# Patient Record
Sex: Female | Born: 1970 | Race: White | Hispanic: No | Marital: Married | Smoking: Never smoker
Health system: Southern US, Community
[De-identification: ages and names within clinical notes are randomized; demographics above are authoritative.]

## PROBLEM LIST (undated history)

## (undated) DIAGNOSIS — L0291 Cutaneous abscess, unspecified: Secondary | ICD-10-CM

## (undated) DIAGNOSIS — Z8619 Personal history of other infectious and parasitic diseases: Secondary | ICD-10-CM

## (undated) DIAGNOSIS — F419 Anxiety disorder, unspecified: Secondary | ICD-10-CM

## (undated) DIAGNOSIS — C50312 Malignant neoplasm of lower-inner quadrant of left female breast: Secondary | ICD-10-CM

## (undated) HISTORY — DX: Personal history of other infectious and parasitic diseases: Z86.19

## (undated) HISTORY — DX: Anxiety disorder, unspecified: F41.9

## (undated) HISTORY — PX: MASTECTOMY PARTIAL / LUMPECTOMY: SUR851

## (undated) HISTORY — DX: Malignant neoplasm of lower-inner quadrant of left female breast: C50.312

## (undated) HISTORY — DX: Cutaneous abscess, unspecified: L02.91

---

## 2010-03-30 ENCOUNTER — Emergency Department (HOSPITAL_COMMUNITY)
Admission: EM | Admit: 2010-03-30 | Discharge: 2010-03-30 | Payer: Self-pay | Source: Home / Self Care | Admitting: Emergency Medicine

## 2010-04-03 LAB — DIFFERENTIAL
Basophils Absolute: 0 10*3/uL (ref 0.0–0.1)
Basophils Relative: 0 % (ref 0–1)
Eosinophils Absolute: 0.1 10*3/uL (ref 0.0–0.7)
Eosinophils Relative: 2 % (ref 0–5)
Lymphocytes Relative: 27 % (ref 12–46)
Lymphs Abs: 1.1 10*3/uL (ref 0.7–4.0)
Monocytes Absolute: 0.3 10*3/uL (ref 0.1–1.0)
Monocytes Relative: 8 % (ref 3–12)
Neutro Abs: 2.6 10*3/uL (ref 1.7–7.7)
Neutrophils Relative %: 63 % (ref 43–77)

## 2010-04-03 LAB — POCT I-STAT, CHEM 8
BUN: 10 mg/dL (ref 6–23)
Calcium, Ion: 1.15 mmol/L (ref 1.12–1.32)
Chloride: 105 mEq/L (ref 96–112)
Creatinine, Ser: 0.9 mg/dL (ref 0.4–1.2)
Glucose, Bld: 96 mg/dL (ref 70–99)
HCT: 38 % (ref 36.0–46.0)
Hemoglobin: 12.9 g/dL (ref 12.0–15.0)
Potassium: 4.1 mEq/L (ref 3.5–5.1)
Sodium: 140 mEq/L (ref 135–145)
TCO2: 25 mmol/L (ref 0–100)

## 2010-04-03 LAB — URINE MICROSCOPIC-ADD ON

## 2010-04-03 LAB — CBC
HCT: 37.4 % (ref 36.0–46.0)
Hemoglobin: 12.7 g/dL (ref 12.0–15.0)
MCH: 30.5 pg (ref 26.0–34.0)
MCHC: 34 g/dL (ref 30.0–36.0)
MCV: 89.7 fL (ref 78.0–100.0)
Platelets: 191 10*3/uL (ref 150–400)
RBC: 4.17 MIL/uL (ref 3.87–5.11)
RDW: 12.3 % (ref 11.5–15.5)
WBC: 4.2 10*3/uL (ref 4.0–10.5)

## 2010-04-03 LAB — URINALYSIS, ROUTINE W REFLEX MICROSCOPIC
Bilirubin Urine: NEGATIVE
Hgb urine dipstick: NEGATIVE
Ketones, ur: NEGATIVE mg/dL
Nitrite: NEGATIVE
Protein, ur: NEGATIVE mg/dL
Specific Gravity, Urine: 1.007 (ref 1.005–1.030)
Urine Glucose, Fasting: NEGATIVE mg/dL
Urobilinogen, UA: 1 mg/dL (ref 0.0–1.0)
pH: 7.5 (ref 5.0–8.0)

## 2011-04-03 ENCOUNTER — Ambulatory Visit (INDEPENDENT_AMBULATORY_CARE_PROVIDER_SITE_OTHER): Payer: Self-pay | Admitting: General Surgery

## 2011-04-19 ENCOUNTER — Ambulatory Visit (INDEPENDENT_AMBULATORY_CARE_PROVIDER_SITE_OTHER): Payer: 59 | Admitting: General Surgery

## 2011-05-10 ENCOUNTER — Ambulatory Visit (INDEPENDENT_AMBULATORY_CARE_PROVIDER_SITE_OTHER): Payer: 59 | Admitting: General Surgery

## 2011-05-14 ENCOUNTER — Ambulatory Visit (INDEPENDENT_AMBULATORY_CARE_PROVIDER_SITE_OTHER): Payer: 59 | Admitting: General Surgery

## 2011-05-14 ENCOUNTER — Encounter (INDEPENDENT_AMBULATORY_CARE_PROVIDER_SITE_OTHER): Payer: Self-pay | Admitting: General Surgery

## 2011-05-14 VITALS — BP 114/78 | HR 80 | Temp 99.1°F | Resp 16 | Ht 64.0 in | Wt 138.4 lb

## 2011-05-14 DIAGNOSIS — K603 Anal fistula: Secondary | ICD-10-CM

## 2011-05-14 NOTE — Progress Notes (Signed)
Patient ID: Jeanette Macdonald, female   DOB: 01-Dec-1970, 41 y.o.   MRN: [id number]  Chief Complaint  Patient presents with  . Abscess    new pt- eval peri-anal abscess    HPI Jeanette Macdonald is a 41 y.o. female.  Referred by Dr. Loreta Ave HPI This is a 88 yof who presents after having what sounds like a perianal abscess incised and drained about a year and a half ago in [place].  After that she had continued drainage from the incision site on her left buttock.  Eventually she began noting drainage from her anus that was similar in nature.  This will heal over, then swell and drain intermittently since then. She moved in the interim and that has accounted for delay in treatment.  She denies fevers.  She denied any changes in bowel movements or blood to me today.  There are really no relieving or aggravating factors she associates with this.    Past Medical History  Diagnosis Date  . Abscess     perianal    Past Surgical History  Procedure Date  . Cesarean section 02/02/08    Family History  Problem Relation Age of Onset  . Cancer Maternal Grandmother     stomach  . Cancer Paternal Grandmother     colon    Social History History  Substance Use Topics  . Smoking status: Never Smoker   . Smokeless tobacco: Not on file  . Alcohol Use: Yes    Allergies  Allergen Reactions  . Penicillins Hives    No current outpatient prescriptions on file.    Review of Systems Review of Systems  Constitutional: Negative for fever, chills and unexpected weight change.  HENT: Negative for hearing loss, congestion, sore throat, trouble swallowing and voice change.   Eyes: Negative for visual disturbance.  Respiratory: Negative for cough and wheezing.   Cardiovascular: Negative for chest pain, palpitations and leg swelling.  Gastrointestinal: Negative for nausea, vomiting, abdominal pain, diarrhea, constipation, blood in stool, abdominal distention and anal bleeding.  Genitourinary: Negative for  hematuria, vaginal bleeding and difficulty urinating.  Musculoskeletal: Negative for arthralgias.  Skin: Negative for rash and wound.  Neurological: Negative for seizures, syncope and headaches.  Hematological: Negative for adenopathy. Does not bruise/bleed easily.  Psychiatric/Behavioral: Negative for confusion.    Blood pressure 114/78, pulse 80, temperature 99.1 F (37.3 C), temperature source Temporal, resp. rate 16, height 5\' 4"  (1.626 m), weight 138 lb 6.4 oz (62.778 kg).  Physical Exam Physical Exam  Vitals reviewed. Constitutional: She appears well-developed and well-nourished.  Cardiovascular: Normal rate, regular rhythm and normal heart sounds.   Pulmonary/Chest: Effort normal and breath sounds normal. She has no wheezes. She has no rales.  Abdominal: Soft. There is no hepatomegaly.  Genitourinary: Rectal exam shows mass and tenderness (on anoscopy I do not identify an internal opening but did see some of what appeared to be whitish drainage from anus). Rectal exam shows no external hemorrhoid, no internal hemorrhoid, no fissure and anal tone normal.    Pelvic exam was performed with patient in the knee-chest position.    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  We discussed surgery.  I think this is obviously not going to get better conservatively.  We discussed going to the OR for exam under anesthesia.  She has no difficulty with continence right now.  I discussed opening fistula and leaving open to heal by secondary intention, seton placement as well as fibrin plug placement depending on findings on exam under anesthesia.  We discussed postoperative course associated with this procedure.  It is not urgent given chronicity of disease.  We discussed risks including recurrence, infection, operative  injury.  She understands.  Will discuss with her husband and call.       Donnalee Cellucci 05/14/2011, 9:16 PM

## 2012-01-18 ENCOUNTER — Telehealth: Payer: Self-pay | Admitting: Obstetrics and Gynecology

## 2012-01-19 ENCOUNTER — Ambulatory Visit (INDEPENDENT_AMBULATORY_CARE_PROVIDER_SITE_OTHER): Payer: 59 | Admitting: Family Medicine

## 2012-01-19 VITALS — BP 103/67 | HR 73 | Temp 98.0°F | Resp 16 | Ht 64.5 in | Wt 139.0 lb

## 2012-01-19 DIAGNOSIS — R05 Cough: Secondary | ICD-10-CM

## 2012-01-19 DIAGNOSIS — R059 Cough, unspecified: Secondary | ICD-10-CM

## 2012-01-19 DIAGNOSIS — R198 Other specified symptoms and signs involving the digestive system and abdomen: Secondary | ICD-10-CM

## 2012-01-19 DIAGNOSIS — B349 Viral infection, unspecified: Secondary | ICD-10-CM

## 2012-01-19 DIAGNOSIS — R1011 Right upper quadrant pain: Secondary | ICD-10-CM

## 2012-01-19 LAB — POCT CBC
Granulocyte percent: 56.8 %G (ref 37–80)
HCT, POC: 46.3 % (ref 37.7–47.9)
Hemoglobin: 14.6 g/dL (ref 12.2–16.2)
Lymph, poc: 1.9 (ref 0.6–3.4)
MCH, POC: 29.4 pg (ref 27–31.2)
MCHC: 31.5 g/dL — AB (ref 31.8–35.4)
MCV: 93.4 fL (ref 80–97)
MID (cbc): 0.4 (ref 0–0.9)
MPV: 9.3 fL (ref 0–99.8)
POC Granulocyte: 3 (ref 2–6.9)
POC LYMPH PERCENT: 36.5 %L (ref 10–50)
POC MID %: 6.7 %M (ref 0–12)
Platelet Count, POC: 246 10*3/uL (ref 142–424)
RBC: 4.96 M/uL (ref 4.04–5.48)
RDW, POC: 13.2 %
WBC: 5.3 10*3/uL (ref 4.6–10.2)

## 2012-01-19 NOTE — Progress Notes (Signed)
He is a 41 year old married woman (with a 41-year-old daughter) who works in Education officer, environmental. Beginning 6 days ago patient developed malaise and fatigue and felt that she's coming down with a virus. Beginning on Halloween night 48 hours prior to this visit, patient developed a hypersensitivity burning pain in the skin of her right abdomen right flank and left mid abdomen. She thought she might be developing shingles but she has not seen any rash. She's had no fever, no deep abdominal pain, no nausea no vomiting, no diarrhea. She has had a dry cough however for several days. She's not had any sore throat and no blood parietum.  Objective: Thin healthy appearing woman in no acute distress HEENT: Unremarkable Chest: Clear Abdomen: Soft nontender without HSM or masses Skin: No rash, patient does show goosebumps diffusely on her back Results for orders placed in visit on 01/19/12  POCT CBC      Component Value Range   WBC 5.3  4.6 - 10.2 K/uL   Lymph, poc 1.9  0.6 - 3.4   POC LYMPH PERCENT 36.5  10 - 50 %L   MID (cbc) 0.4  0 - 0.9   POC MID % 6.7  0 - 12 %M   POC Granulocyte 3.0  2 - 6.9   Granulocyte percent 56.8  37 - 80 %G   RBC 4.96  4.04 - 5.48 M/uL   Hemoglobin 14.6  12.2 - 16.2 g/dL   HCT, POC 21.3  08.6 - 47.9 %   MCV 93.4  80 - 97 fL   MCH, POC 29.4  27 - 31.2 pg   MCHC 31.5 (*) 31.8 - 35.4 g/dL   RDW, POC 57.8     Platelet Count, POC 246  142 - 424 K/uL   MPV 9.3  0 - 99.8 fL     Assessment:  This appears to be a viral syndrome and should resolve over the next several days

## 2012-04-04 ENCOUNTER — Encounter: Payer: Self-pay | Admitting: Obstetrics and Gynecology

## 2012-04-04 ENCOUNTER — Ambulatory Visit: Payer: 59 | Admitting: Obstetrics and Gynecology

## 2012-04-04 VITALS — BP 100/60 | HR 82 | Ht 64.0 in | Wt 140.0 lb

## 2012-04-04 DIAGNOSIS — Z124 Encounter for screening for malignant neoplasm of cervix: Secondary | ICD-10-CM

## 2012-04-04 DIAGNOSIS — Z1231 Encounter for screening mammogram for malignant neoplasm of breast: Secondary | ICD-10-CM

## 2012-04-04 MED ORDER — ALPRAZOLAM 0.5 MG PO TABS: 0.5000 mg | ORAL_TABLET | Freq: Every evening | ORAL | Status: DC | PRN

## 2012-04-04 NOTE — Progress Notes (Signed)
Last Pap: 06/22/10 WNL: Yes Regular Periods:yes Contraception: husband vasectomy   Monthly Breast exam:no Tetanus<83yrs:yes Nl.Bladder Function:yes Daily BMs:yes Healthy Diet:yes Calcium:no Mammogram:yes Date of Mammogram: 4 years ago Exercise:no Have often Exercise: n/a Seatbelt: yes Abuse at home: no Stressful work:yes Sigmoid-colonoscopy: 1 yr ago per pt due to fistula Bone Density: No PCP:  Change in PMH: none  Change in [facility]: none BP 100/60  Pulse 82  Ht 5\' 4"  (1.626 m)  Wt 140 lb (63.504 kg)  BMI 24.03 kg/m2  LMP 03/12/2012 Pt here for AEX.  She would like xanax to use on her flights.  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  Small fistula seen on perineum Physical Examination: Extremities - peripheral pulses normal, no pedal edema, no clubbing or cyanosis AEX exam Pap sent Pt with rectovaginal fistula.  She was seen by GI and general surgery.  She desires to monitor and observe for now Anxiety with flying.  Pt given xanax prn flying.  R&B with possible addiction discussed in detail Pt refused breast exam.  She did schedule mammogram  Extremities - peripheral pulses normal, no pedal edema, no clubbing or cyanosis

## 2012-04-07 LAB — PAP IG W/ RFLX HPV ASCU

## 2012-04-29 ENCOUNTER — Ambulatory Visit: Payer: 59

## 2012-06-02 ENCOUNTER — Ambulatory Visit
Admission: RE | Admit: 2012-06-02 | Discharge: 2012-06-02 | Disposition: A | Discharge status: Home / Self Care | Payer: 59 | Source: Ambulatory Visit | Attending: Obstetrics and Gynecology | Admitting: Obstetrics and Gynecology

## 2012-06-02 DIAGNOSIS — Z1231 Encounter for screening mammogram for malignant neoplasm of breast: Secondary | ICD-10-CM

## 2013-02-26 ENCOUNTER — Other Ambulatory Visit: Payer: Self-pay | Admitting: Gastroenterology

## 2013-02-26 DIAGNOSIS — K611 Rectal abscess: Secondary | ICD-10-CM

## 2013-02-26 DIAGNOSIS — R1032 Left lower quadrant pain: Secondary | ICD-10-CM

## 2013-03-13 ENCOUNTER — Other Ambulatory Visit: Payer: 59

## 2013-03-22 ENCOUNTER — Inpatient Hospital Stay: Admission: RE | Admit: 2013-03-22 | Payer: 59 | Source: Ambulatory Visit

## 2013-10-23 ENCOUNTER — Emergency Department (HOSPITAL_COMMUNITY)
Admission: EM | Admit: 2013-10-23 | Discharge: 2013-10-23 | Disposition: A | Discharge status: Home / Self Care | Payer: 59 | Attending: Emergency Medicine | Admitting: Emergency Medicine

## 2013-10-23 ENCOUNTER — Emergency Department (HOSPITAL_COMMUNITY): Payer: 59

## 2013-10-23 ENCOUNTER — Encounter (HOSPITAL_COMMUNITY): Payer: Self-pay | Admitting: Emergency Medicine

## 2013-10-23 DIAGNOSIS — Z872 Personal history of diseases of the skin and subcutaneous tissue: Secondary | ICD-10-CM | POA: Insufficient documentation

## 2013-10-23 DIAGNOSIS — Z7982 Long term (current) use of aspirin: Secondary | ICD-10-CM | POA: Insufficient documentation

## 2013-10-23 DIAGNOSIS — F411 Generalized anxiety disorder: Secondary | ICD-10-CM | POA: Diagnosis not present

## 2013-10-23 DIAGNOSIS — R072 Precordial pain: Secondary | ICD-10-CM | POA: Insufficient documentation

## 2013-10-23 DIAGNOSIS — Z880 Allergy status to penicillin: Secondary | ICD-10-CM | POA: Insufficient documentation

## 2013-10-23 DIAGNOSIS — R079 Chest pain, unspecified: Secondary | ICD-10-CM

## 2013-10-23 LAB — BASIC METABOLIC PANEL
Anion gap: 11 (ref 5–15)
BUN: 11 mg/dL (ref 6–23)
CO2: 25 mEq/L (ref 19–32)
Calcium: 9.3 mg/dL (ref 8.4–10.5)
Chloride: 100 mEq/L (ref 96–112)
Creatinine, Ser: 0.78 mg/dL (ref 0.50–1.10)
GFR calc Af Amer: >90 mL/min (ref >90)
GFR calc non Af Amer: >90 mL/min (ref >90)
Glucose, Bld: 79 mg/dL (ref 70–99)
Potassium: 4.4 mEq/L (ref 3.7–5.3)
Sodium: 136 mEq/L — ABNORMAL LOW (ref 137–147)

## 2013-10-23 LAB — CBC
HCT: 37 % (ref 36.0–46.0)
Hemoglobin: 12.9 g/dL (ref 12.0–15.0)
MCH: 30.8 pg (ref 26.0–34.0)
MCHC: 34.9 g/dL (ref 30.0–36.0)
MCV: 88.3 fL (ref 78.0–100.0)
Platelets: 152 10*3/uL (ref 150–400)
RBC: 4.19 MIL/uL (ref 3.87–5.11)
RDW: 12.3 % (ref 11.5–15.5)
WBC: 3 10*3/uL — ABNORMAL LOW (ref 4.0–10.5)

## 2013-10-23 LAB — I-STAT TROPONIN, ED
Troponin i, poc: 0 ng/mL (ref 0.00–0.08)
Troponin i, poc: 0 ng/mL (ref 0.00–0.08)

## 2013-10-23 MED ORDER — NITROGLYCERIN 0.4 MG SL SUBL
0.4000 mg | SUBLINGUAL_TABLET | SUBLINGUAL | Status: DC | PRN
  Filled 2013-10-23: qty 1

## 2013-10-23 MED ORDER — ASPIRIN 325 MG PO TABS
325.0000 mg | ORAL_TABLET | Freq: Once | ORAL | Status: AC
  Administered 2013-10-23: 325 mg via ORAL
  Filled 2013-10-23: qty 1

## 2013-10-23 MED ORDER — GI COCKTAIL ~~LOC~~
30.0000 mL | Freq: Once | ORAL | Status: AC
  Administered 2013-10-23: 30 mL via ORAL
  Filled 2013-10-23: qty 30

## 2013-10-23 NOTE — ED Notes (Signed)
Patient transported to X-ray 

## 2013-10-23 NOTE — H&P (Signed)
Jeanette Macdonald is an 43 y.o. female.   Chief Complaint: Chest pain HPI:   The patient is a 43yo female with a history of anxiety.  No prior cardiac issues.  Her parents and siblings have no history of CAD.   She has no risk factors.   She reports developing CP at 0700hrs this morning. She describes it as pressure, 4/10, right sided under her clavical with radiation to her back.  No N, V, diaphoresis, SOB.  It does appear to worsen with a deep breath or with exertion.  She also has been a little dizzy.  She denies fever, orthopnea, PND, cough, congestion, abdominal pain, hematochezia, melena, lower extremity edema, claudication.   Past Medical History  Diagnosis Date  . Abscess     perianal  . Anxiety     Past Surgical History  Procedure Laterality Date  . Cesarean section  02/02/08    Family History  Problem Relation Age of Onset  . Cancer Maternal Grandmother     stomach  . Cancer Paternal Grandmother     colon   Social History:  reports that she has never smoked. She does not have any smokeless tobacco history on file. She reports that she drinks alcohol. She reports that she does not use illicit drugs.  Allergies:  Allergies  Allergen Reactions  . Penicillins Hives     (Not in a hospital admission)  Results for orders placed during the hospital encounter of 10/23/13 (from the past 48 hour(s))  CBC     Status: Abnormal   Collection Time    10/23/13 12:48 PM      Result Value Ref Range   WBC 3.0 (*) 4.0 - 10.5 K/uL   RBC 4.19  3.87 - 5.11 MIL/uL   Hemoglobin 12.9  12.0 - 15.0 g/dL   HCT 37.0  36.0 - 46.0 %   MCV 88.3  78.0 - 100.0 fL   MCH 30.8  26.0 - 34.0 pg   MCHC 34.9  30.0 - 36.0 g/dL   RDW 12.3  11.5 - 15.5 %   Platelets 152  150 - 400 K/uL  BASIC METABOLIC PANEL     Status: Abnormal   Collection Time    10/23/13 12:48 PM      Result Value Ref Range   Sodium 136 (*) 137 - 147 mEq/L   Potassium 4.4  3.7 - 5.3 mEq/L   Chloride 100  96 - 112 mEq/L   CO2  25  19 - 32 mEq/L   Glucose, Bld 79  70 - 99 mg/dL   BUN 11  6 - 23 mg/dL   Creatinine, Ser 0.78  0.50 - 1.10 mg/dL   Calcium 9.3  8.4 - 10.5 mg/dL   GFR calc non Af Amer >90  >90 mL/min   GFR calc Af Amer >90  >90 mL/min   Comment: (NOTE)     The eGFR has been calculated using the CKD EPI equation.     This calculation has not been validated in all clinical situations.     eGFR's persistently <90 mL/min signify possible Chronic Kidney     Disease.   Anion gap 11  5 - 15  I-STAT TROPOININ, ED     Status: None   Collection Time    10/23/13  1:06 PM      Result Value Ref Range   Troponin i, poc 0.00  0.00 - 0.08 ng/mL   Comment 3  Comment: Due to the release kinetics of cTnI,     a negative result within the first hours     of the onset of symptoms does not rule out     myocardial infarction with certainty.     If myocardial infarction is still suspected,     repeat the test at appropriate intervals.   Dg Chest 2 View  10/23/2013   CLINICAL DATA:  Chest pain.  EXAM: CHEST  2 VIEW  COMPARISON:  None.  FINDINGS: Multiple monitoring leads overlie the chest. Cardiomediastinal silhouette is within normal limits. The lungs are well inflated and clear. No pleural effusion or pneumothorax is identified. No acute osseous abnormality is seen.  IMPRESSION: No active cardiopulmonary disease.   Electronically Signed   By: Logan Bores   On: 10/23/2013 13:28    Review of Systems  All other systems reviewed and are negative.  +CP mild dizziness.  No nausea, vomiting, fever, shortness of breath, orthopnea,  PND, cough, congestion, abdominal pain, hematochezia, melena, lower extremity edema, claudication.  Blood pressure 112/77, pulse 67, temperature 97.6 F (36.4 C), temperature source Oral, resp. rate 12, last menstrual period 10/16/2013, SpO2 100.00%. Physical Exam  Nursing note and vitals reviewed. Constitutional: She is oriented to person, place, and time. She appears well-developed  and well-nourished. No distress.  HENT:  Head: Normocephalic and atraumatic.  Mouth/Throat: No oropharyngeal exudate.  Eyes: EOM are normal. Pupils are equal, round, and reactive to light. No scleral icterus.  Neck: Normal range of motion. Neck supple.  Cardiovascular: Normal rate, regular rhythm, S1 normal and S2 normal.   No murmur heard. Pulses:      Radial pulses are 2+ on the right side, and 2+ on the left side.       Dorsalis pedis pulses are 2+ on the right side, and 2+ on the left side.  No Carotid bruit  Respiratory: Effort normal and breath sounds normal. She has no wheezes. She has no rales.  GI: Soft. Bowel sounds are normal. She exhibits no distension. There is no tenderness.  Musculoskeletal: She exhibits no edema.  Lymphadenopathy:    She has no cervical adenopathy.  Neurological: She is alert and oriented to person, place, and time. She exhibits normal muscle tone.  Skin: Skin is warm and dry.  Psychiatric: She has a normal mood and affect.     Assessment/Plan Chest Pain, typical and atypical features  43 yo female with no prior cardiac history.  No family history of CAD and no risk factors for CAD, presents with right sided(under clavical) chest pressure which was 4/10 and worse with a deep breath and/or exertion..  Troponin POC times 2 negative.  EKG is without ischemic changes.  We were planning to offer admission with treadmill in the morning or OP treadmill with a script for SL NTG, however, the patient left the hospital before arrangements could be made.   We will order an OP treadmill and try to contact her at home to arrange.  [place], [place], [place]. 10/23/2013, 2:40 PM

## 2013-10-23 NOTE — ED Notes (Signed)
Gave Dr. Jeneen Rinks EKG. No ST elevation.  EKG interpretation marked through by MD

## 2013-10-23 NOTE — Discharge Instructions (Signed)

## 2013-10-23 NOTE — ED Provider Notes (Signed)
CSN: [id number]     Arrival date & time 10/23/13  1132 History   First MD Initiated Contact with Patient 10/23/13 1205     Chief Complaint  Patient presents with  . Chest Pain     (Consider location/radiation/quality/duration/timing/severity/associated sxs/prior Treatment) Patient is a 43 y.o. female presenting with chest pain.  Chest Pain Pain location:  Substernal area Pain quality: pressure   Pain radiates to:  Upper back, neck and R shoulder Pain radiates to the back: yes   Pain severity:  Moderate Onset quality:  Sudden Timing:  Constant Progression:  Waxing and waning Chronicity:  New Context: at rest   Relieved by:  Rest Worsened by:  Exertion and deep breathing Ineffective treatments:  Aspirin Associated symptoms: no abdominal pain, no fever, no nausea and not vomiting     Past Medical History  Diagnosis Date  . Abscess     perianal  . Anxiety    Past Surgical History  Procedure Laterality Date  . Cesarean section  02/02/08   Family History  Problem Relation Age of Onset  . Cancer Maternal Grandmother     stomach  . Cancer Paternal Grandmother     colon   History  Substance Use Topics  . Smoking status: Never Smoker   . Smokeless tobacco: Not on file  . Alcohol Use: Yes     Comment: occassionally    OB History   Grav Para Term Preterm Abortions TAB SAB Ect Mult Living   3 1        1      Review of Systems  Constitutional: Negative for fever.  Cardiovascular: Positive for chest pain.  Gastrointestinal: Negative for nausea, vomiting and abdominal pain.  All other systems reviewed and are negative.     Allergies  Penicillins  Home Medications   Prior to Admission medications   Medication Sig Start Date End Date Taking? Authorizing Provider  ALPRAZolam Duanne Moron) 0.5 MG tablet Take 1 tablet (0.5 mg total) by mouth at bedtime as needed for sleep or anxiety. 04/04/12  Yes Naima A Dillard, MD  aspirin EC 81 MG tablet Take 81 mg by mouth daily.    Yes Historical Provider, MD   BP 102/71  Pulse 74  Temp(Src) 98.1 F (36.7 C) (Oral)  Resp 16  SpO2 100%  LMP 10/16/2013 Physical Exam  Nursing note and vitals reviewed. Constitutional: She is oriented to person, place, and time. She appears well-developed and well-nourished. No distress.  HENT:  Head: Normocephalic and atraumatic.  Eyes: EOM are normal. Pupils are equal, round, and reactive to light.  Neck: Normal range of motion. Neck supple.  Cardiovascular: Normal rate and regular rhythm.  Exam reveals no friction rub.   No murmur heard. Pulmonary/Chest: Effort normal and breath sounds normal. No respiratory distress. She has no wheezes. She has no rales.  Abdominal: Soft. She exhibits no distension. There is no tenderness. There is no rebound.  Musculoskeletal: Normal range of motion. She exhibits no edema.  Neurological: She is alert and oriented to person, place, and time.  Skin: She is not diaphoretic.    ED Course  Procedures (including critical care time) Labs Review Labs Reviewed  [place], ED    Imaging Review No results found.   EKG Interpretation   Date/Time:  Friday October 23 2013 11:41:19 EDT Ventricular Rate:  73 PR Interval:  161 QRS Duration: 105 QT Interval:  403 QTC Calculation: 444 R Axis:   38  Text Interpretation:  Sinus rhythm RSR' in V1 or V2, right VCD or RVH ST  elevation, consider inferior injury No prior Confirmed by [facility]  MD,  Marquavis Hannen ([id number]) on 10/23/2013 12:09:55 PM      MDM   Final diagnoses:  Chest pain, unspecified chest pain type    18F with no cardiac risk factors presents with CP concerning for ACS. Heaviness, began at rest, worse with exertion, radiates to R shoulder, neck. Exam benign. EKG benign. Will check labs, consult Cardiology. Patient feeling better on reexam and she refuses to stay. Want sto pick up her daughter from preschool and on vacation tomorrow. Cards offered admission  for treadmill stress test, but patient left before these arrangements could be made. Patient will follow up appointments from cardiology. Negative troponin x2.  Evelina Bucy, MD 10/23/13 [phone]

## 2013-10-23 NOTE — ED Notes (Signed)
Per pt, woke up this morning at 7am and started having chest pain.  Pt states no shortness of breath, no cough, no fever.  No recent travel. Pain in rt chest to right shoulder.  No cardiac history.

## 2014-01-01 NOTE — Progress Notes (Signed)
CM received a phone call from the patient stating she would like to know the cardiologist name and the provider office that she received care from during her last hospital visit. She stated that she did not follow up at the time but now she would like to pursue the follow up with cardiology. This CM read the patient the follow up instructions from her discharge instructions with the cardiology provider office and phone number and explained that the information is on her discharge paperwork if she still has it. This CM also provided the patient with the cardiology PA name that provided care during her hospital visit. The patient verbalized understanding and had no other questions or concerns. Edwyna Shell, RN, BSN, Case Manager [phone] 01/01/2014 1:44 PM

## 2014-01-06 ENCOUNTER — Ambulatory Visit (INDEPENDENT_AMBULATORY_CARE_PROVIDER_SITE_OTHER): Payer: 59 | Admitting: Cardiovascular Disease

## 2014-01-06 ENCOUNTER — Encounter: Payer: Self-pay | Admitting: Cardiovascular Disease

## 2014-01-06 VITALS — BP 112/68 | HR 89 | Ht 64.0 in | Wt 135.0 lb

## 2014-01-06 DIAGNOSIS — R002 Palpitations: Secondary | ICD-10-CM | POA: Insufficient documentation

## 2014-01-06 DIAGNOSIS — R072 Precordial pain: Secondary | ICD-10-CM

## 2014-01-06 NOTE — Patient Instructions (Signed)
Your physician recommends that you schedule a follow-up appointment in:  About 6-8 weeks.   Your physician has requested that you have an echocardiogram. Echocardiography is a painless test that uses sound waves to create images of your heart. It provides your doctor with information about the size and shape of your heart and how well your heart's chambers and valves are working. This procedure takes approximately one hour. There are no restrictions for this procedure.  Your physician has recommended that you wear a holter monitor. Holter monitors are medical devices that record the heart's electrical activity. Doctors most often use these monitors to diagnose arrhythmias. Arrhythmias are problems with the speed or rhythm of the heartbeat. The monitor is a small, portable device. You can wear one while you do your normal daily activities. This is usually used to diagnose what is causing palpitations/syncope (passing out).  Lab work to be done today--BMP, TSH

## 2014-01-06 NOTE — Progress Notes (Signed)
     History of Present Illness: 43 yo female with history of anxiety here today for evaluation of chest pain. She was seen in the ED August 2015 with chest heaviness. She was seen by the Cardiology team but refused admission or stress testing. GI cocktail did relieve her chest pressure. She has had no recurrent chest pressure. She does however describe a recent episode of heart racing, fluttering. She felt weak and dizzy. The episode lasted for 15-20 seconds. These occur once per month. No LE edema.   Primary Care Physician: None    Past Medical History  Diagnosis Date  . Abscess     perianal  . Anxiety     Flying    Past Surgical History  Procedure Laterality Date  . Cesarean section  02/02/08    Current Outpatient Prescriptions  Medication Sig Dispense Refill  . ALPRAZolam (XANAX) 0.5 MG tablet Take 1 tablet (0.5 mg total) by mouth at bedtime as needed for sleep or anxiety.  20 tablet  0  . aspirin EC 81 MG tablet Take 81 mg by mouth daily.       No current facility-administered medications for this visit.    Allergies  Allergen Reactions  . Penicillins Hives    History   Social History  . Marital Status: Married    Spouse Name: N/A    Number of Children: 1  . Years of Education: N/A   Occupational History  . Finance    Social History Main Topics  . Smoking status: Never Smoker   . Smokeless tobacco: Not on file  . Alcohol Use: Yes     Comment: occasionally   . Drug Use: No  . Sexual Activity: Yes    Birth Control/ Protection: Surgical     Comment: husband vasectomy    Other Topics Concern  . Not on file   Social History Narrative  . No narrative on file    Family History  Problem Relation Age of Onset  . Cancer Maternal Grandmother     stomach  . Cancer Paternal Grandmother     colon  . CAD Neg Hx     Review of Systems:  As stated in the HPI and otherwise negative.   BP 112/68  Pulse 89  Ht 5\' 4"  (1.626 m)  Wt 135 lb (61.236 kg)  BMI  23.16 kg/m2  Physical Examination: General: Well developed, well nourished, NAD HEENT: OP clear, mucus membranes moist SKIN: warm, dry. No rashes. Neuro: No focal deficits Musculoskeletal: Muscle strength 5/5 all ext Psychiatric: Mood and affect normal Neck: No JVD, no carotid bruits, no thyromegaly, no lymphadenopathy. Lungs:Clear bilaterally, no wheezes, rhonci, crackles Cardiovascular: Regular rate and rhythm. No murmurs, gallops or rubs. Abdomen:Soft. Bowel sounds present. Non-tender.  Extremities: No lower extremity edema. Pulses are 2 + in the bilateral DP/PT.  EKG: NSR, rate 89 bpm. Incomplete RBBB.   Assessment and Plan:   1. Chest pain: Atypical. Most likely not cardiac. Will arrange echo to assess LVEF  2. Palpitations: Most likely premature beats although could be SVT. Will arrange 48 hour monitor. Will check TSH and BMET. Echo as above to exclude structural heart disease.

## 2014-01-07 LAB — BASIC METABOLIC PANEL
BUN: 11 mg/dL (ref 6–23)
CO2: 28 mEq/L (ref 19–32)
Calcium: 9.2 mg/dL (ref 8.4–10.5)
Chloride: 103 mEq/L (ref 96–112)
Creatinine, Ser: 1 mg/dL (ref 0.4–1.2)
GFR: 67.57 mL/min (ref >60.00)
Glucose, Bld: 73 mg/dL (ref 70–99)
Potassium: 3.7 mEq/L (ref 3.5–5.1)
Sodium: 138 mEq/L (ref 135–145)

## 2014-01-07 LAB — TSH: TSH: 1.23 u[IU]/mL (ref 0.35–4.50)

## 2014-01-11 ENCOUNTER — Other Ambulatory Visit (HOSPITAL_COMMUNITY): Payer: 59

## 2014-01-18 ENCOUNTER — Encounter: Payer: Self-pay | Admitting: Cardiovascular Disease

## 2014-01-25 ENCOUNTER — Encounter: Payer: Self-pay | Admitting: *Deleted

## 2014-02-19 ENCOUNTER — Ambulatory Visit: Payer: 59 | Admitting: Cardiovascular Disease

## 2015-12-22 ENCOUNTER — Other Ambulatory Visit: Payer: Self-pay | Admitting: Obstetrics and Gynecology

## 2015-12-22 ENCOUNTER — Ambulatory Visit
Admission: RE | Admit: 2015-12-22 | Discharge: 2015-12-22 | Disposition: A | Discharge status: Home / Self Care | Payer: 59 | Source: Ambulatory Visit | Attending: Obstetrics and Gynecology | Admitting: Obstetrics and Gynecology

## 2015-12-22 DIAGNOSIS — R2232 Localized swelling, mass and lump, left upper limb: Secondary | ICD-10-CM

## 2015-12-22 DIAGNOSIS — N6324 Unspecified lump in the left breast, lower inner quadrant: Secondary | ICD-10-CM

## 2015-12-22 DIAGNOSIS — N6320 Unspecified lump in the left breast, unspecified quadrant: Secondary | ICD-10-CM

## 2015-12-26 ENCOUNTER — Ambulatory Visit
Admission: RE | Admit: 2015-12-26 | Discharge: 2015-12-26 | Disposition: A | Discharge status: Home / Self Care | Payer: 59 | Source: Ambulatory Visit | Attending: Obstetrics and Gynecology | Admitting: Obstetrics and Gynecology

## 2015-12-26 ENCOUNTER — Other Ambulatory Visit: Payer: Self-pay | Admitting: Obstetrics and Gynecology

## 2015-12-26 DIAGNOSIS — N6320 Unspecified lump in the left breast, unspecified quadrant: Secondary | ICD-10-CM

## 2015-12-26 DIAGNOSIS — R2232 Localized swelling, mass and lump, left upper limb: Secondary | ICD-10-CM

## 2015-12-29 ENCOUNTER — Encounter: Payer: Self-pay | Admitting: *Deleted

## 2015-12-29 ENCOUNTER — Telehealth: Payer: Self-pay | Admitting: *Deleted

## 2015-12-29 DIAGNOSIS — C50312 Malignant neoplasm of lower-inner quadrant of left female breast: Secondary | ICD-10-CM

## 2015-12-29 HISTORY — DX: Malignant neoplasm of lower-inner quadrant of left female breast: C50.312

## 2015-12-29 NOTE — Telephone Encounter (Signed)
Confirmed [facility] for 01/04/16 at 0815 .  Instructions and contact information given.

## 2016-01-04 ENCOUNTER — Encounter: Payer: Self-pay | Admitting: *Deleted

## 2016-01-04 ENCOUNTER — Ambulatory Visit: Payer: 59 | Attending: General Surgery | Admitting: Physical Therapy

## 2016-01-04 ENCOUNTER — Encounter: Payer: Self-pay | Admitting: Hematology

## 2016-01-04 ENCOUNTER — Encounter: Payer: Self-pay | Admitting: Physical Therapy

## 2016-01-04 ENCOUNTER — Ambulatory Visit
Admission: RE | Admit: 2016-01-04 | Discharge: 2016-01-04 | Disposition: A | Discharge status: Home / Self Care | Payer: 59 | Source: Ambulatory Visit | Attending: Radiation Oncology | Admitting: Radiation Oncology

## 2016-01-04 ENCOUNTER — Ambulatory Visit (HOSPITAL_BASED_OUTPATIENT_CLINIC_OR_DEPARTMENT_OTHER): Payer: 59 | Admitting: Hematology

## 2016-01-04 ENCOUNTER — Other Ambulatory Visit (HOSPITAL_BASED_OUTPATIENT_CLINIC_OR_DEPARTMENT_OTHER): Payer: 59

## 2016-01-04 ENCOUNTER — Encounter: Payer: Self-pay | Admitting: Genetic Counselor

## 2016-01-04 VITALS — BP 112/75 | HR 80 | Temp 98.5°F | Resp 18 | Ht 64.0 in | Wt 133.6 lb

## 2016-01-04 DIAGNOSIS — C50312 Malignant neoplasm of lower-inner quadrant of left female breast: Secondary | ICD-10-CM

## 2016-01-04 DIAGNOSIS — Z170 Estrogen receptor positive status [ER+]: Secondary | ICD-10-CM

## 2016-01-04 DIAGNOSIS — R293 Abnormal posture: Secondary | ICD-10-CM | POA: Diagnosis present

## 2016-01-04 DIAGNOSIS — F419 Anxiety disorder, unspecified: Secondary | ICD-10-CM

## 2016-01-04 LAB — CBC WITH DIFFERENTIAL/PLATELET
BASO%: 0.7 % (ref 0.0–2.0)
BASOS ABS: 0 10*3/uL (ref 0.0–0.1)
EOS ABS: 0.1 10*3/uL (ref 0.0–0.5)
EOS%: 2.6 % (ref 0.0–7.0)
HEMATOCRIT: 40.8 % (ref 34.8–46.6)
HEMOGLOBIN: 14.3 g/dL (ref 11.6–15.9)
LYMPH#: 1.2 10*3/uL (ref 0.9–3.3)
LYMPH%: 28.2 % (ref 14.0–49.7)
MCH: 31 pg (ref 25.1–34.0)
MCHC: 35 g/dL (ref 31.5–36.0)
MCV: 88.3 fL (ref 79.5–101.0)
MONO#: 0.4 10*3/uL (ref 0.1–0.9)
MONO%: 9.7 % (ref 0.0–14.0)
NEUT%: 58.8 % (ref 38.4–76.8)
NEUTROS ABS: 2.5 10*3/uL (ref 1.5–6.5)
Platelets: 200 10*3/uL (ref 145–400)
RBC: 4.62 10*6/uL (ref 3.70–5.45)
RDW: 12 % (ref 11.2–14.5)
WBC: 4.2 10*3/uL (ref 3.9–10.3)

## 2016-01-04 LAB — COMPREHENSIVE METABOLIC PANEL
ALBUMIN: 4.1 g/dL (ref 3.5–5.0)
ALK PHOS: 71 U/L (ref 40–150)
ALT: 14 U/L (ref 0–55)
AST: 18 U/L (ref 5–34)
Anion Gap: 9 mEq/L (ref 3–11)
BILIRUBIN TOTAL: 0.65 mg/dL (ref 0.20–1.20)
BUN: 8.8 mg/dL (ref 7.0–26.0)
CALCIUM: 9.5 mg/dL (ref 8.4–10.4)
CO2: 24 mEq/L (ref 22–29)
CREATININE: 0.8 mg/dL (ref 0.6–1.1)
Chloride: 101 mEq/L (ref 98–109)
EGFR: 86 mL/min/{1.73_m2} — ABNORMAL LOW (ref >90)
GLUCOSE: 70 mg/dL (ref 70–140)
POTASSIUM: 4.2 meq/L (ref 3.5–5.1)
SODIUM: 134 meq/L — AB (ref 136–145)
TOTAL PROTEIN: 7.7 g/dL (ref 6.4–8.3)

## 2016-01-04 MED ORDER — DEXAMETHASONE 4 MG PO TABS: ORAL_TABLET | ORAL | 1 refills | Status: DC

## 2016-01-04 MED ORDER — PROCHLORPERAZINE MALEATE 10 MG PO TABS: 10.0000 mg | ORAL_TABLET | Freq: Four times a day (QID) | ORAL | 1 refills | Status: DC | PRN

## 2016-01-04 MED ORDER — ONDANSETRON HCL 8 MG PO TABS: 8.0000 mg | ORAL_TABLET | Freq: Two times a day (BID) | ORAL | 1 refills | Status: DC | PRN

## 2016-01-04 MED ORDER — ALPRAZOLAM 0.5 MG PO TABS: 0.5000 mg | ORAL_TABLET | Freq: Every evening | ORAL | 0 refills | Status: AC | PRN

## 2016-01-04 NOTE — Progress Notes (Signed)
START ON PATHWAY REGIMEN - Breast  [id number]: AC-T - [Doxorubicin + Cyclophosphamide q21 Days x 4 Cycles, Followed by Paclitaxel Weekly x 12 Weeks]  Doxorubicin + Cyclophosphamide (AC):   A cycle is every 21 days:     Doxorubicin (Adriamycin(R)) 60 mg/m2 IV Push followed by Dose Mod: None     Cyclophosphamide (Cytoxan(R)) 600 mg/m2 in 250 mL NS IV over 30 minutes Dose Mod: None  **Always confirm dose/schedule in your pharmacy ordering system**    Paclitaxel 80 mg/m2 Weekly:   Administer weekly:     Paclitaxel (Taxol(R)) 80 mg/m2 in 250 mL NS IV over 1 hour Dose Mod: None  **Always confirm dose/schedule in your pharmacy ordering system**    Patient Characteristics: Neoadjuvant Chemotherapy, 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

## 2016-01-04 NOTE — Progress Notes (Signed)
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  Pt with ER+, P+ and HER2 -. PMHx: palpitations  Pt reports decreased intake due to diagnosis of cancer.  This is a normal cycle for her during stressful times.   Medications:  reviewed  Labs: reviewed  Anthropometrics: Wt 133 lb 9.6 oz, Ht 5'4 ", BMI 23.  Reports wt loss of 4 pounds since diagnosis of cancer  NUTRITION DIAGNOSIS: Food and Nutrition Related knowledge Deficit related to new diagnosis of breast cancer as evidenced by no prior need for nutrition related information.  INTERVENTION:  Provided packet of nutrition information regarding managing treatment side effects, eating well balanced plant-based diet with whole grains including good sources of protein. We discussed soy foods, organic foods and myths regarding sugar intake.  Pt and husband's questions answered.  Business card given for further questions or concerns.     MONITORING, EVALUATION, and GOAL: Pt will consume a healthy plant based diet to maintain lean body mass throughout treatment.   Arvind Mexicano B. Zenia Resides, [place], [place] (pager) Weekend/On-Call pager [phone])

## 2016-01-04 NOTE — Therapy (Signed)
[place], [place], [address] Phone: [phone]   Fax:  [phone]  Physical Therapy Evaluation  Patient Details  Name: Jeanette Macdonald MRN: [id number] Date of Birth: Oct 26, 1970 Referring Provider: Dr. Excell Seltzer  Encounter Date: 01/04/2016      PT End of Session - 01/04/16 1328    Visit Number 1   Number of Visits 1   PT Start Time 6962   PT Stop Time 1100  Also saw pt from 1130-1140 for a total of 25 minutes   PT Time Calculation (min) 15 min   Activity Tolerance Patient tolerated treatment well   Behavior During Therapy [facility] for tasks assessed/performed      Past Medical History:  Diagnosis Date  . Abscess    perianal  . Anxiety    Flying  . History of hepatitis B    20+ years ago  . Malignant neoplasm of lower-inner quadrant of left female breast ([place]) 12/29/2015    Past Surgical History:  Procedure Laterality Date  . CESAREAN SECTION  02/02/08    There were no vitals filed for this visit.       Subjective Assessment - 01/04/16 1329    Subjective Patient reports she is here to be seen by her medical team for her newly diagnosed left breast cancer.   Patient is accompained by: Family member   Pertinent History Patient was diagnosed on 12/22/15 with left grade 3 invasive ductal carcinoma breast cancer.  It is ER/PR positive and HER2 negative with a Ki67 of 70%. Her mass is located in the lower inner quadrant and measures 3.1 cm.  She also has a known positive axillary lymph node.    Patient Stated Goals Reduce lymphedema risk and learn post op shoulder ROM HEP            [facility] PT Assessment - 01/04/16 0001      Assessment   Medical Diagnosis Left breast cancer   Referring Provider Dr. Excell Seltzer   Onset Date/Surgical Date 12/22/15   Hand Dominance Right   Prior Therapy nonenone     Precautions   Precautions Other (comment)   Precaution Comments Active cancer      Restrictions   Weight Bearing Restrictions No     Balance Screen   Has the patient fallen in the past 6 months No   Has the patient had a decrease in activity level because of a fear of falling?  No   Is the patient reluctant to leave their home because of a fear of falling?  No     Home Social worker Private residence   Living Arrangements Spouse/significant other;Children  Husband and 39 y.o. daughter   Available Help at Discharge Family     Prior Function   Level of Independence Independent   Vocation Full time employment   Vocation Requirements office work   Leisure She walks once a week for 20 minutes but owns a stationary bike     Cognition   Overall Cognitive Status Within Functional Limits for tasks assessed     Posture/Postural Control   Posture/Postural Control Postural limitations   Postural Limitations Forward head;Rounded Shoulders     ROM / Strength   AROM / PROM / Strength AROM;Strength     AROM   AROM Assessment Site Shoulder;Cervical   Right/Left Shoulder Right;Left   Right Shoulder Extension 55 Degrees   Right Shoulder Flexion 160 Degrees   Right Shoulder ABduction  178 Degrees   Right Shoulder Internal Rotation 81 Degrees   Right Shoulder External Rotation 78 Degrees   Left Shoulder Extension 57 Degrees   Left Shoulder Flexion 165 Degrees   Left Shoulder ABduction 170 Degrees   Left Shoulder Internal Rotation 78 Degrees   Left Shoulder External Rotation 88 Degrees   Cervical Flexion WNL   Cervical Extension WNL   Cervical - Right Side Bend WNL   Cervical - Left Side Bend WNL   Cervical - Right Rotation WNL   Cervical - Left Rotation WNL     Strength   Overall Strength Within functional limits for tasks performed           LYMPHEDEMA/ONCOLOGY QUESTIONNAIRE - 01/04/16 1325      Type   Cancer Type Left breast cancer     Lymphedema Assessments   Lymphedema Assessments Upper extremities     Right Upper Extremity  Lymphedema   10 cm Proximal to Olecranon Process 25 cm   Olecranon Process 21.9 cm   10 cm Proximal to Ulnar Styloid Process 20 cm   Just Proximal to Ulnar Styloid Process 14.5 cm   Across Hand at [employer] 16.8 cm   At [place] of 2nd Digit 5.4 cm     Left Upper Extremity Lymphedema   10 cm Proximal to Olecranon Process 25 cm   Olecranon Process 22.1 cm   10 cm Proximal to Ulnar Styloid Process 18.8 cm   Just Proximal to Ulnar Styloid Process 14.2 cm   Across Hand at [employer] 15.8 cm   At [place] of 2nd Digit 5.5 cm      Patient was instructed today in a home exercise program today for post op shoulder range of motion. These included active assist shoulder flexion in sitting, scapular retraction, wall walking with shoulder abduction, and hands behind head external rotation.  She was encouraged to do these twice a day, holding 3 seconds and repeating 5 times when permitted by her physician.         PT Education - 01/04/16 1327    Education provided Yes   Education Details Lymphedema risk reduction and post op shoulder ROM HEP   Person(s) Educated Patient;Spouse   Methods Explanation;Demonstration;Handout   Comprehension Verbalized understanding;Returned demonstration              Breast Clinic Goals - 01/04/16 1333      Patient will be able to verbalize understanding of pertinent lymphedema risk reduction practices relevant to her diagnosis specifically related to skin care.   Time 1   Period Days   Status Achieved     Patient will be able to return demonstrate and/or verbalize understanding of the post-op home exercise program related to regaining shoulder range of motion.   Time 1   Period Days   Status Achieved     Patient will be able to verbalize understanding of the importance of attending the postoperative After Breast Cancer Class for further lymphedema risk reduction education and therapeutic exercise.   Time 1   Period Days   Status Achieved               Plan - 01/04/16 1329    Clinical Impression Statement Patient was diagnosed on 12/22/15 with left grade 3 invasive ductal carcinoma breast cancer.  It is ER/PR positive and HER2 negative with a Ki67 of 70%. Her mass is located in the lower inner quadrant and measures 3.1 cm.  She also has a  known positive axillary lymph node.  Her multidisciplinary medical team met prior to her assessments to determine a recommended treatment plan. She is planning to have neoadjuvant chemotherapy, a left lumpectomy with a targeted node dissection, radiation, and anti-estrogen therapy. She will benefit from post op PT to regain shoulder ROM and reduce lymphedema risk.  Due to her lack of comorbidities, her eval is of low complexity.   Rehab Potential Excellent   Clinical Impairments Affecting Rehab Potential none   PT Frequency One time visit   PT Treatment/Interventions Patient/family education;Therapeutic exercise   PT Next Visit Plan Will f/u after surgery to determine needs for PT   PT Home Exercise Plan Post op shoulder ROM HEP   Consulted and Agree with Plan of Care Patient;Family member/caregiver   Family Member Consulted Husband      Patient will benefit from skilled therapeutic intervention in order to improve the following deficits and impairments:  Postural dysfunction, Decreased knowledge of precautions, Pain, Impaired UE functional use, Decreased range of motion  Visit Diagnosis: Abnormal posture - Plan: PT plan of care cert/re-cert  Carcinoma of lower-inner quadrant of left breast in female, estrogen receptor positive ([place]) - Plan: PT plan of care cert/re-cert   Patient will follow up at outpatient cancer rehab if needed following surgery.  If the patient requires physical therapy at that time, a specific plan will be dictated and sent to the referring physician for approval. The patient was educated today on appropriate basic range of motion exercises to begin post operatively  and the importance of attending the After Breast Cancer class following surgery.  Patient was educated today on lymphedema risk reduction practices as it pertains to recommendations that will benefit the patient immediately following surgery.  She verbalized good understanding.  No additional physical therapy is indicated at this time.     Problem List Patient Active Problem List   Diagnosis Date Noted  . Malignant neoplasm of lower-inner quadrant of left female breast ([place]) 12/29/2015  . Palpitations 01/06/2014   Annia Friendly, PT 01/04/16 1:37 PM  [place], [place], [address] Phone: [phone]   Fax:  [phone]  Name: Jeanette Macdonald MRN: [id number] Date of Birth: 05-12-1970

## 2016-01-04 NOTE — Progress Notes (Signed)
Clinical Social Work [place] Psychosocial Distress Screening [place]  Patient completed distress screening protocol and scored an 8  on the Psychosocial Distress Thermometer which indicates moderate distress. Clinical Social Worker met with patient and patients husband in [facility] to assess for distress and other psychosocial needs. Patient stated she was feeling overwhelmed but felt "better" after meeting with the treatment team and getting more information on her treatment plan. CSW and patient discussed common feeling and emotions when being diagnosed with cancer, and the importance of support during treatment. Patients main concern was telling her young daughter about her diagnosis.  CSW informed patient of the support team and support services at [facility]. CSW provided contact information and encouraged patient to call with any questions or concerns.  ONCBCN DISTRESS SCREENING 01/04/2016  Screening Type Initial Screening  Distress experienced in past week (1-10) 8  Emotional problem type Nervousness/Anxiety;Adjusting to illness  Information Concerns Type Lack of info about diagnosis;Lack of info about treatment  Physician notified of physical symptoms Yes  Referral to clinical social work Yes     Johnnye Lana, MSW, LCSW, OSW-C Clinical Social Worker [facility] [phone]

## 2016-01-04 NOTE — Patient Instructions (Signed)

## 2016-01-04 NOTE — Progress Notes (Signed)
Radiation Oncology         [phone] ________________________________  Initial outpatient Consultation  Name: Jeanette Macdonald MRN: [id number]  Date: 01/04/2016  DOB: June 18, 1970  GM:WNUUVOZ,DGUYQ A, MD  Crawford Givens, MD   REFERRING PHYSICIAN: Crawford Givens, MD  DIAGNOSIS:    ICD-9-CM ICD-10-CM   1. 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 female who presented with palpable left breast mass for 2 months that was discovered during a self-exam. This was followed by a mammogram on 12/22/15 which showed approximately a 3 cm spiculated left interior breast mass. Ultrasound revealed a 3.1 cm mass as well as an abnormal appearing lymph node. Both were  biopsied revealing grade III invasive ductal carcinoma ER+, PR +, Her2-, Ki67 70%.  She is in her usual state of health otherwise  PREVIOUS RADIATION THERAPY: No  PAST MEDICAL HISTORY:  has a past medical history of Abscess; Anxiety; History of hepatitis B; and Malignant neoplasm of lower-inner quadrant of left female breast ([place]) (12/29/2015).    PAST SURGICAL HISTORY: Past Surgical History:  Procedure Laterality Date  . CESAREAN SECTION  02/02/08    FAMILY HISTORY: family history includes Cancer in her maternal aunt, maternal grandfather, maternal grandmother, paternal grandfather, and paternal grandmother.  SOCIAL HISTORY:  reports that she has never smoked. She has never used smokeless tobacco. She reports that she does not drink alcohol or use drugs. She works as a Research officer, political party. She presents with husband and she has an 68 yo daughter.  ALLERGIES: Penicillins  MEDICATIONS:  Current Outpatient Prescriptions  Medication Sig Dispense Refill  . ALPRAZolam (XANAX) 0.5 MG tablet Take 1 tablet  (0.5 mg total) by mouth at bedtime as needed for sleep or anxiety. 20 tablet 0  . aspirin EC 81 MG tablet Take 81 mg by mouth daily.    . Azelaic Acid (FINACEA) 15 % cream every evening. After skin is thoroughly washed and patted dry, gently but thoroughly massage a thin film of azelaic acid cream into the affected area twice daily, in the morning and evening.     No current facility-administered medications for this encounter.     REVIEW OF SYSTEMS:  Notable for that above. A 15 point review of systems was positive for weight change, left breast pain, nose bleeding, sore throat with a head cold, productive cough, lump and dimpling in the left breast, skin rash, and anxiety. She is negative for all other symptoms.   PHYSICAL EXAM:   Vitals with BMI 01/04/2016  Height _0   Weight 133 lbs 10 oz  BMI 23  Systolic 034  Diastolic 75  Pulse 80  Respirations 18   General: Alert and oriented, in no acute distress HEENT: Head is normocephalic. Extraocular movements are intact. Oropharynx is clear. Neck: Neck is supple, no palpable cervical or supraclavicular lymphadenopathy. Heart: Regular in rate and rhythm with no murmurs, rubs, or gallops. Chest: Clear to auscultation bilaterally, with no rhonchi, wheezes, or rales. Abdomen: Soft, nontender, nondistended, with no rigidity or guarding. Extremities: No cyanosis or edema. Lymphatics: see Neck Exam Skin: No concerning lesions. Musculoskeletal: symmetric strength and muscle tone throughout. Neurologic: Cranial nerves II through XII are grossly intact. No obvious focalities. Speech is fluent. Coordination is intact. Psychiatric: Judgment and  insight are intact. Affect is appropriate. Breasts: Left breast, lower inner quadrant, there is a palpable mass that is about 3 cm in greatest dimension. In the 12-1 o'clock position of breast, there is a rash that appears reactive . In the left underarm there is, what I believe to be, a rounded mobile lymph  node of about 1.5 cm in dimension. This may be the lymph node that was biopsied or it may be reactive. No other palpable masses appreciated in the breasts or axillae.   ECOG = 0  0 - Asymptomatic (Fully active, able to carry on all predisease activities without restriction)  1 - Symptomatic but completely ambulatory (Restricted in physically strenuous activity but ambulatory and able to carry out work of a light or sedentary nature. For example, light housework, office work)  2 - Symptomatic, <50% in bed during the day (Ambulatory and capable of all self care but unable to carry out any work activities. Up and about more than 50% of waking hours)  3 - Symptomatic, >50% in bed, but not bedbound (Capable of only limited self-care, confined to bed or chair 50% or more of waking hours)  4 - Bedbound (Completely disabled. Cannot carry on any self-care. Totally confined to bed or chair)  5 - Death   Eustace Pen MM, Creech RH, Tormey DC, et al. [phone]). "Toxicity and response criteria of the [facility]". [place] Oncol. 5 (6): 649-55   LABORATORY DATA:  Lab Results  Component Value Date   WBC 4.2 01/04/2016   HGB 14.3 01/04/2016   HCT 40.8 01/04/2016   MCV 88.3 01/04/2016   PLT 200 01/04/2016   CMP     Component Value Date/Time   NA 134 (L) 01/04/2016 0807   K 4.2 01/04/2016 0807   CL 103 01/06/2014 1527   CO2 24 01/04/2016 0807   GLUCOSE 70 01/04/2016 0807   BUN 8.8 01/04/2016 0807   CREATININE 0.8 01/04/2016 0807   CALCIUM 9.5 01/04/2016 0807   PROT 7.7 01/04/2016 0807   ALBUMIN 4.1 01/04/2016 0807   AST 18 01/04/2016 0807   ALT 14 01/04/2016 0807   ALKPHOS 71 01/04/2016 0807   BILITOT 0.65 01/04/2016 0807   GFRNONAA >90 10/23/2013 1248   GFRAA >90 10/23/2013 1248         RADIOGRAPHY: Mm Digital Diagnostic Unilat L  Result Date: 12/26/2015 CLINICAL DATA:  Post biopsy mammogram of the left breast for clip placement. EXAM: DIAGNOSTIC LEFT MAMMOGRAM  POST ULTRASOUND BIOPSY COMPARISON:  Previous exam(s). FINDINGS: Mammographic images were obtained following ultrasound guided biopsy of left breast mass and left axillary lymph node. The ribbon shaped biopsy marking clip is appropriately positioned at the biopsy mass in the left breast at 6:30. The HydroMARK clip in the left axilla is not included in field tissue. IMPRESSION: 1. Appropriate positioning of the ribbon shaped biopsy marking clip within the left breast mass at 6:30. 2. The HydroMARK clip in the left axilla is not included in the field of view. Final Assessment: Post Procedure Mammograms for Marker Placement Electronically Signed   By: Ammie Ferrier M.D.   On: 12/26/2015 11:30   US Breast Ltd Uni Left Inc Axilla  Result Date: 12/22/2015 CLINICAL DATA:  45 year old female presenting for evaluation of a palpable lump felt in the left breast for 2 months. EXAM: 2D DIGITAL DIAGNOSTIC UNILATERAL LEFT MAMMOGRAM WITH CAD AND ADJUNCT TOMO LEFT BREAST ULTRASOUND COMPARISON:  Previous exam(s). ACR Breast Density Category b: There are scattered  areas of fibroglandular density. FINDINGS: In the inferior aspect of the left breast, middle depth a palpable marker has been placed overlying an irregular spiculated mass which measures approximately 3 cm mammographically. Mammographic images were processed with CAD. Physical exam of the inferior left breast demonstrates a firm palpable superficial mass. Ultrasound of the left breast at 6:30, 7 cm from the nipple demonstrates an irregular hypoechoic mass measuring 3.1 x 2.0 x 2.9 cm. Ultrasound of the left axilla demonstrates 1 markedly enlarged lymph node with cortex measuring approximately 6 mm. IMPRESSION: 1. The palpable mass in the left breast corresponds with a highly suspicious 3.1 cm mass at 6:30. 2.  There is a markedly abnormal left axillary lymph node. RECOMMENDATION: Ultrasound-guided biopsy is recommended for the left breast mass and the left axillary  lymph node. This has been scheduled for 12/26/2015 at 9:30 a.m. I have discussed the findings and recommendations with the patient. Results were also provided in writing at the conclusion of the visit. If applicable, a reminder letter will be sent to the patient regarding the next appointment. BI-RADS CATEGORY  5: Highly suggestive of malignancy. Electronically Signed   By: Ammie Ferrier M.D.   On: 12/22/2015 15:36   Mm Diag Breast Tomo Uni Left  Result Date: 12/22/2015 CLINICAL DATA:  45 year old female presenting for evaluation of a palpable lump felt in the left breast for 2 months. EXAM: 2D DIGITAL DIAGNOSTIC UNILATERAL LEFT MAMMOGRAM WITH CAD AND ADJUNCT TOMO LEFT BREAST ULTRASOUND COMPARISON:  Previous exam(s). ACR Breast Density Category b: There are scattered areas of fibroglandular density. FINDINGS: In the inferior aspect of the left breast, middle depth a palpable marker has been placed overlying an irregular spiculated mass which measures approximately 3 cm mammographically. Mammographic images were processed with CAD. Physical exam of the inferior left breast demonstrates a firm palpable superficial mass. Ultrasound of the left breast at 6:30, 7 cm from the nipple demonstrates an irregular hypoechoic mass measuring 3.1 x 2.0 x 2.9 cm. Ultrasound of the left axilla demonstrates 1 markedly enlarged lymph node with cortex measuring approximately 6 mm. IMPRESSION: 1. The palpable mass in the left breast corresponds with a highly suspicious 3.1 cm mass at 6:30. 2.  There is a markedly abnormal left axillary lymph node. RECOMMENDATION: Ultrasound-guided biopsy is recommended for the left breast mass and the left axillary lymph node. This has been scheduled for 12/26/2015 at 9:30 a.m. I have discussed the findings and recommendations with the patient. Results were also provided in writing at the conclusion of the visit. If applicable, a reminder letter will be sent to the patient regarding the next  appointment. BI-RADS CATEGORY  5: Highly suggestive of malignancy. Electronically Signed   By: Ammie Ferrier M.D.   On: 12/22/2015 15:36   [place] Lt Breast Bx W Loc Dev 1st Lesion Img Bx Spec US Guide  Addendum Date: 12/28/2015   ADDENDUM REPORT: 12/28/2015 13:57 ADDENDUM: Pathology revealed GRADE III INVASIVE DUCTAL CARCINOMA of the Left breast at the 6:30 o'clock location. 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 This was found to be concordant by Dr. Ammie Ferrier. Pathology results were discussed with the patient by telephone. The patient reported doing well after the biopsies with tenderness at the sites. Post biopsy instructions and care were reviewed and questions were answered. The patient was encouraged to call [facility] for any additional concerns. The patient was referred to [facility] on January 04, 2016. Pathology results reported by Terie Purser, RN on 12/28/2015. Electronically Signed   By: Ammie Ferrier M.D.   On: 12/28/2015 13:57   Result Date: 12/28/2015 CLINICAL DATA:  45 year old female presenting for ultrasound-guided biopsy of a left breast mass and left axillary lymph node. EXAM: ULTRASOUND GUIDED LEFT BREAST CORE NEEDLE BIOPSY COMPARISON:  Previous exam(s). FINDINGS: I met with the patient and we discussed the procedure of ultrasound-guided biopsy, including benefits and alternatives. We discussed the high likelihood of a successful procedure. We discussed the risks of the procedure, including infection, bleeding, tissue injury, clip migration, and inadequate sampling. Informed written consent was given. The usual time-out protocol was performed immediately prior to the procedure. Using sterile technique and 1% Lidocaine as local anesthetic, under direct ultrasound visualization, a 14 gauge spring-loaded device was used to perform biopsy of a left  breast mass at 6:30 using a lateral approach. At the conclusion of the procedure a ribbon shaped tissue marker clip was deployed into the biopsy cavity. Using sterile technique and 1% Lidocaine as local anesthetic, under direct ultrasound visualization, a 14 gauge spring-loaded device was used to perform biopsy of a left axillary lymph node using a lateral approach. At the conclusion of the procedure a HydroMARK tissue marker clip was deployed into the biopsy cavity. Follow up 2 view mammogram was performed and dictated separately. IMPRESSION: 1. Ultrasound guided biopsy of left breast mass at 6:30. No apparent complications. 2. Ultrasound-guided biopsy of a left axillary lymph node. No apparent complications. Electronically Signed: By: Ammie Ferrier M.D. On: 12/26/2015 10:42   [place] Lt Breast Bx W Loc Dev Ea Add Lesion Img Bx Spec US Guide  Addendum Date: 12/28/2015   ADDENDUM REPORT: 12/28/2015 13:57 ADDENDUM: Pathology revealed GRADE III INVASIVE DUCTAL CARCINOMA of the Left breast at the 6:30 o'clock location. 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 This was found to be concordant by Dr. Ammie Ferrier. Pathology results were discussed with the patient by telephone. The patient reported doing well after the biopsies with tenderness at the sites. Post biopsy instructions and care were reviewed and questions were answered. The patient was encouraged to call [facility] for any additional concerns. The patient was referred to [facility] on January 04, 2016. Pathology results reported by Terie Purser, RN on 12/28/2015. Electronically Signed   By: Ammie Ferrier M.D.   On: 12/28/2015 13:57   Result Date: 12/28/2015 CLINICAL DATA:  45 year old female presenting for ultrasound-guided biopsy of a left breast mass and left axillary lymph node. EXAM: ULTRASOUND GUIDED LEFT BREAST CORE  NEEDLE BIOPSY COMPARISON:  Previous exam(s). FINDINGS: I met with the patient and we discussed the procedure of ultrasound-guided biopsy, including benefits and alternatives. We discussed the high likelihood of a successful procedure. We discussed the risks of the procedure, including infection, bleeding, tissue injury, clip migration, and inadequate sampling. Informed written consent was given. The usual time-out protocol was performed immediately prior to the procedure. Using sterile technique and 1% Lidocaine as local anesthetic, under direct ultrasound visualization, a 14 gauge spring-loaded device was used to perform biopsy of a left breast mass at 6:30 using a lateral approach. At the conclusion of the procedure a ribbon shaped tissue marker clip was deployed into the biopsy cavity. Using sterile technique and 1% Lidocaine as local anesthetic, under direct ultrasound visualization, a 14 gauge spring-loaded device was used to perform  biopsy of a left axillary lymph node using a lateral approach. At the conclusion of the procedure a HydroMARK tissue marker clip was deployed into the biopsy cavity. Follow up 2 view mammogram was performed and dictated separately. IMPRESSION: 1. Ultrasound guided biopsy of left breast mass at 6:30. No apparent complications. 2. Ultrasound-guided biopsy of a left axillary lymph node. No apparent complications. Electronically Signed: By: Ammie Ferrier M.D. On: 12/26/2015 10:42      IMPRESSION/PLAN:  Stage IIB T2N1Mx Breast LI Q Invasive Ductal Carcinoma, ER+ / PR+ / Her2-, Grade III  We discussed her case in detail. I answered her questions to the best of my ability.  Patient expects neoadjuvant chemotherapy followed by breast conservation therapy. It was a pleasure meeting the patient today. We discussed the risks, benefits, and side effects of radiotherapy. I recommend adjuvant radiotherapy to the left breast and lymph nodes (supraclavicular, axillary, and consider  internal mammary) to reduce her risk of locoregional recurrence by 2/3.  We discussed that radiation would take approximately 6 weeks to complete and that I would give the patient a few weeks to heal following surgery before starting treatment planning. We spoke about acute effects including skin irritation and fatigue as well as much less common late effects including internal organ injury or irritation. We spoke about the latest technology that is used to minimize the risk of late effects for patients undergoing radiotherapy to the breast or chest wall. No guarantees of treatment were given. The patient is enthusiastic about proceeding with treatment. I look forward to participating in the patient's care.   __________________________________________   Eppie Gibson, MD   This document serves as a record of services personally performed by Eppie Gibson, MD. It was created on her behalf by Bethann Humble, a trained medical scribe. The creation of this record is based on the scribe's personal observations and the provider's statements to them. This document has been checked and approved by the attending provider.

## 2016-01-04 NOTE — Progress Notes (Addendum)
[place]  [phone] [phone]  Clinic New Consult Note   Patient Care Team: Crawford Givens, MD as PCP - General (Obstetrics and Gynecology) Excell Seltzer, MD as Consulting Physician (General Surgery) Truitt Merle, MD as Consulting Physician (Hematology) Eppie Gibson, MD as Attending Physician (Radiation Oncology) 01/04/2016  CHIEF COMPLAINTS/PURPOSE OF CONSULTATION:  Newly diagnosed left breast cancer  Oncology History   Malignant neoplasm of lower-inner quadrant of left female breast [facility])   Staging form: Breast, AJCC 7th Edition   - Clinical stage from 12/26/2015: Stage IIB (T2, N1, cM0(i+)) - Signed by Truitt Merle, MD on 01/04/2016      Malignant neoplasm of lower-inner quadrant of left female breast ([place])   12/22/2015 Mammogram    Mammagram and US showed a 3.1cm mass at 6:30, and a markedly abnormal left axillary node       12/26/2015 Initial Diagnosis    Malignant neoplasm of lower-inner quadrant of left female breast ([place])      12/26/2015 Initial Biopsy    Both left breast and axillary node biopsy showed G3 invasive ductal carcinoma      12/26/2015 Receptors her2    ER80%+, PR80%+, HER2 (-), Ki67 70%       HISTORY OF PRESENTING ILLNESS:  Jeanette Macdonald 45 y.o. female is here because of Her recently diagnosed left breast cancer. She is accompanied by her husband to our multidisciplinary clinic today.  She has been doing screening mammograms every year, last one in early 2017 (per pt) was negative. She noticed a left breast mass 8 weeks ago, no  or pain, no skin or nipple change. She underwent a diagnostic mammogram and ultrasound of the left breast, which showed a 3.1 cm mass at 6:30 clock position, and all markedly abnormal left axillary nodes with cortical thickening. She underwent left breast mass and axilla node biopsy on 12/26/2015, which showed grade 3 invasive ductal carcinoma, ER/PR positive and HER-2 negative.  She otehrwise  feels well, she is anxious, lost a fe lbs since the diagnosis of her breast cancer. She is very physically active, lives with her husband and daughter. She has no family history of breast or ovarian cancer.  GYN HISTORY  Menarchal: 11 LMP: 12/21/2015 (regular)  Contraceptive: no  HRT: none  G3P1: 45 yo girl     MEDICAL HISTORY:  Past Medical History:  Diagnosis Date  . Abscess    perianal  . Anxiety    Flying  . History of hepatitis B    20+ years ago  . Malignant neoplasm of lower-inner quadrant of left female breast ([place]) 12/29/2015    SURGICAL HISTORY: Past Surgical History:  Procedure Laterality Date  . CESAREAN SECTION  02/02/08    SOCIAL HISTORY: Social History   Social History  . Marital status: Married    Spouse name: N/A  . Number of children: 1  . Years of education: N/A   Occupational History  . [employer]   Social History Main Topics  . Smoking status: Never Smoker  . Smokeless tobacco: Never Used  . Alcohol use No     Comment: occasionally   . Drug use: No  . Sexual activity: Yes    Birth control/ protection: Surgical     Comment: husband vasectomy    Other Topics Concern  . Not on file   Social History Narrative  . No narrative on file    FAMILY HISTORY: Family History  Problem Relation Age of Onset  . Cancer  Maternal Grandmother     stomach  . Cancer Paternal Grandmother     lung cancer   . Cancer Maternal Aunt     liver cancer  . Cancer Maternal Grandfather     lung cancer  . Cancer Paternal Grandfather     lung cancer  . CAD Neg Hx     ALLERGIES:  is allergic to penicillins.  MEDICATIONS:  Current Outpatient Prescriptions  Medication Sig Dispense Refill  . Azelaic Acid (FINACEA) 15 % cream every evening. After skin is thoroughly washed and patted dry, gently but thoroughly massage a thin film of azelaic acid cream into the affected area twice daily, in the morning and evening.    Marland Kitchen ALPRAZolam (XANAX) 0.5 MG tablet Take  1 tablet (0.5 mg total) by mouth at bedtime as needed for sleep or anxiety. 20 tablet 0  . aspirin EC 81 MG tablet Take 81 mg by mouth daily.     No current facility-administered medications for this visit.     REVIEW OF SYSTEMS:   Constitutional: Denies fevers, chills or abnormal night sweats Eyes: Denies blurriness of vision, double vision or watery eyes Ears, nose, mouth, throat, and face: Denies mucositis or sore throat Respiratory: Denies cough, dyspnea or wheezes Cardiovascular: Denies palpitation, chest discomfort or lower extremity swelling Gastrointestinal:  Denies nausea, heartburn or change in bowel habits Skin: Denies abnormal skin rashes Lymphatics: Denies new lymphadenopathy or easy bruising Neurological:Denies numbness, tingling or new weaknesses Behavioral/Psych: Mood is stable, no new changes  All other systems were reviewed with the patient and are negative.  PHYSICAL EXAMINATION: ECOG PERFORMANCE STATUS: 0 - Asymptomatic  Vitals:   01/04/16 0846  BP: 112/75  Pulse: 80  Resp: 18  Temp: 98.5 F (36.9 C)   Filed Weights   01/04/16 0846  Weight: 133 lb 9.6 oz (60.6 kg)    GENERAL:alert, no distress and comfortable SKIN: skin color, texture, turgor are normal, no rashes or significant lesions EYES: normal, conjunctiva are pink and non-injected, sclera clear OROPHARYNX:no exudate, no erythema and lips, buccal mucosa, and tongue normal  NECK: supple, thyroid normal size, non-tender, without nodularity LYMPH:  no palpable lymphadenopathy in the cervical, axillary or inguinal LUNGS: clear to auscultation and percussion with normal breathing effort HEART: regular rate & rhythm and no murmurs and no lower extremity edema ABDOMEN:abdomen soft, non-tender and normal bowel sounds Musculoskeletal:no cyanosis of digits and no clubbing  PSYCH: alert & oriented x 3 with fluent speech NEURO: no focal motor/sensory deficits Breasts: Breast inspection showed them to be  symmetrical with no nipple discharge. Palpation of the breasts and axilla revealed a 3.5X2.0cm mass at 6:00 position of left breast, no other obvious mass or Axillary adenopathy that I could appreciate.   LABORATORY DATA:  I have reviewed the data as listed CBC Latest Ref Rng & Units 01/04/2016 10/23/2013 01/19/2012  WBC 3.9 - 10.3 10e3/uL 4.2 3.0(L) 5.3  Hemoglobin 11.6 - 15.9 g/dL 14.3 12.9 14.6  Hematocrit 34.8 - 46.6 % 40.8 37.0 46.3  Platelets 145 - 400 10e3/uL 200 152 -   CMP Latest Ref Rng & Units 01/04/2016 01/06/2014 10/23/2013  Glucose 70 - 140 mg/dl 70 73 79  BUN 7.0 - 26.0 mg/dL 8.'8 11 11  ' Creatinine 0.6 - 1.1 mg/dL 0.8 1.0 0.78  Sodium 136 - 145 mEq/L 134(L) 138 136(L)  Potassium 3.5 - 5.1 mEq/L 4.2 3.7 4.4  Chloride 96 - 112 mEq/L - 103 100  CO2 22 - 29 mEq/L 24 28 25  Calcium 8.4 - 10.4 mg/dL 9.5 9.2 9.3  Total Protein 6.4 - 8.3 g/dL 7.7 - -  Total Bilirubin 0.20 - 1.20 mg/dL 0.65 - -  Alkaline Phos 40 - 150 U/L 71 - -  AST 5 - 34 U/L 18 - -  ALT 0 - 55 U/L 14 - -   PATHOLOGY REPORT  Diagnosis 12/26/2015 1. Breast, left, needle core biopsy, 6:30 o'clock INVASIVE DUCTAL CARCINOMA, GRADE 3 2. Lymph node, needle/core biopsy, left axillary METASTATIC DUCTAL CARCINOMA Microscopic Comment 1. The neoplasm stains positive for E-cadherin, supporting the genotype of ductal carcinoma.  1. PROGNOSTIC INDICATORS  Results: IMMUNOHISTOCHEMICAL AND MORPHOMETRIC ANALYSIS PERFORMED MANUALLY Estrogen Receptor: 80%, POSITIVE, MODERATE STAINING INTENSITY Progesterone Receptor: 80%, POSITIVE, STRONG STAINING INTENSITY Proliferation Marker Ki67: 70% Results: HER2 - NEGATIVE RATIO OF HER2/CEP17 SIGNALS 1.33 AVERAGE HER2 COPY NUMBER PER CELL 2.20  RADIOGRAPHIC STUDIES: I have personally reviewed the radiological images as listed and agreed with the findings in the report. Mm Digital Diagnostic Unilat L  Result Date: 12/26/2015 CLINICAL DATA:  Post biopsy mammogram of the left breast  for clip placement. EXAM: DIAGNOSTIC LEFT MAMMOGRAM POST ULTRASOUND BIOPSY COMPARISON:  Previous exam(s). FINDINGS: Mammographic images were obtained following ultrasound guided biopsy of left breast mass and left axillary lymph node. The ribbon shaped biopsy marking clip is appropriately positioned at the biopsy mass in the left breast at 6:30. The HydroMARK clip in the left axilla is not included in field tissue. IMPRESSION: 1. Appropriate positioning of the ribbon shaped biopsy marking clip within the left breast mass at 6:30. 2. The HydroMARK clip in the left axilla is not included in the field of view. Final Assessment: Post Procedure Mammograms for Marker Placement Electronically Signed   By: Ammie Ferrier M.D.   On: 12/26/2015 11:30   US Breast Ltd Uni Left Inc Axilla  Result Date: 12/22/2015 CLINICAL DATA:  45 year old female presenting for evaluation of a palpable lump felt in the left breast for 2 months. EXAM: 2D DIGITAL DIAGNOSTIC UNILATERAL LEFT MAMMOGRAM WITH CAD AND ADJUNCT TOMO LEFT BREAST ULTRASOUND COMPARISON:  Previous exam(s). ACR Breast Density Category b: There are scattered areas of fibroglandular density. FINDINGS: In the inferior aspect of the left breast, middle depth a palpable marker has been placed overlying an irregular spiculated mass which measures approximately 3 cm mammographically. Mammographic images were processed with CAD. Physical exam of the inferior left breast demonstrates a firm palpable superficial mass. Ultrasound of the left breast at 6:30, 7 cm from the nipple demonstrates an irregular hypoechoic mass measuring 3.1 x 2.0 x 2.9 cm. Ultrasound of the left axilla demonstrates 1 markedly enlarged lymph node with cortex measuring approximately 6 mm. IMPRESSION: 1. The palpable mass in the left breast corresponds with a highly suspicious 3.1 cm mass at 6:30. 2.  There is a markedly abnormal left axillary lymph node. RECOMMENDATION: Ultrasound-guided biopsy is  recommended for the left breast mass and the left axillary lymph node. This has been scheduled for 12/26/2015 at 9:30 a.m. I have discussed the findings and recommendations with the patient. Results were also provided in writing at the conclusion of the visit. If applicable, a reminder letter will be sent to the patient regarding the next appointment. BI-RADS CATEGORY  5: Highly suggestive of malignancy. Electronically Signed   By: Ammie Ferrier M.D.   On: 12/22/2015 15:36   Mm Diag Breast Tomo Uni Left  Result Date: 12/22/2015 CLINICAL DATA:  45 year old female presenting for evaluation of a palpable lump felt in  the left breast for 2 months. EXAM: 2D DIGITAL DIAGNOSTIC UNILATERAL LEFT MAMMOGRAM WITH CAD AND ADJUNCT TOMO LEFT BREAST ULTRASOUND COMPARISON:  Previous exam(s). ACR Breast Density Category b: There are scattered areas of fibroglandular density. FINDINGS: In the inferior aspect of the left breast, middle depth a palpable marker has been placed overlying an irregular spiculated mass which measures approximately 3 cm mammographically. Mammographic images were processed with CAD. Physical exam of the inferior left breast demonstrates a firm palpable superficial mass. Ultrasound of the left breast at 6:30, 7 cm from the nipple demonstrates an irregular hypoechoic mass measuring 3.1 x 2.0 x 2.9 cm. Ultrasound of the left axilla demonstrates 1 markedly enlarged lymph node with cortex measuring approximately 6 mm. IMPRESSION: 1. The palpable mass in the left breast corresponds with a highly suspicious 3.1 cm mass at 6:30. 2.  There is a markedly abnormal left axillary lymph node. RECOMMENDATION: Ultrasound-guided biopsy is recommended for the left breast mass and the left axillary lymph node. This has been scheduled for 12/26/2015 at 9:30 a.m. I have discussed the findings and recommendations with the patient. Results were also provided in writing at the conclusion of the visit. If applicable, a  reminder letter will be sent to the patient regarding the next appointment. BI-RADS CATEGORY  5: Highly suggestive of malignancy. Electronically Signed   By: Ammie Ferrier M.D.   On: 12/22/2015 15:36   [place] Lt Breast Bx W Loc Dev 1st Lesion Img Bx Spec US Guide  Addendum Date: 12/28/2015   ADDENDUM REPORT: 12/28/2015 13:57 ADDENDUM: Pathology revealed GRADE III INVASIVE DUCTAL CARCINOMA of the Left breast at the 6:30 o'clock location. 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 This was found to be concordant by Dr. Ammie Ferrier. Pathology results were discussed with the patient by telephone. The patient reported doing well after the biopsies with tenderness at the sites. Post biopsy instructions and care were reviewed and questions were answered. The patient was encouraged to call [facility] for any additional concerns. The patient was referred to [facility] on January 04, 2016. Pathology results reported by Terie Purser, RN on 12/28/2015. Electronically Signed   By: Ammie Ferrier M.D.   On: 12/28/2015 13:57   Result Date: 12/28/2015 CLINICAL DATA:  45 year old female presenting for ultrasound-guided biopsy of a left breast mass and left axillary lymph node. EXAM: ULTRASOUND GUIDED LEFT BREAST CORE NEEDLE BIOPSY COMPARISON:  Previous exam(s). FINDINGS: I met with the patient and we discussed the procedure of ultrasound-guided biopsy, including benefits and alternatives. We discussed the high likelihood of a successful procedure. We discussed the risks of the procedure, including infection, bleeding, tissue injury, clip migration, and inadequate sampling. Informed written consent was given. The usual time-out protocol was performed immediately prior to the procedure. Using sterile technique and 1% Lidocaine as local anesthetic, under direct ultrasound visualization, a 14 gauge  spring-loaded device was used to perform biopsy of a left breast mass at 6:30 using a lateral approach. At the conclusion of the procedure a ribbon shaped tissue marker clip was deployed into the biopsy cavity. Using sterile technique and 1% Lidocaine as local anesthetic, under direct ultrasound visualization, a 14 gauge spring-loaded device was used to perform biopsy of a left axillary lymph node using a lateral approach. At the conclusion of the procedure a HydroMARK tissue marker clip was deployed into the biopsy cavity. Follow up 2 view mammogram was  performed and dictated separately. IMPRESSION: 1. Ultrasound guided biopsy of left breast mass at 6:30. No apparent complications. 2. Ultrasound-guided biopsy of a left axillary lymph node. No apparent complications. Electronically Signed: By: Ammie Ferrier M.D. On: 12/26/2015 10:42   [place] Lt Breast Bx W Loc Dev Ea Add Lesion Img Bx Spec US Guide  Addendum Date: 12/28/2015   ADDENDUM REPORT: 12/28/2015 13:57 ADDENDUM: Pathology revealed GRADE III INVASIVE DUCTAL CARCINOMA of the Left breast at the 6:30 o'clock location. 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 This was found to be concordant by Dr. Ammie Ferrier. Pathology results were discussed with the patient by telephone. The patient reported doing well after the biopsies with tenderness at the sites. Post biopsy instructions and care were reviewed and questions were answered. The patient was encouraged to call [facility] for any additional concerns. The patient was referred to [facility] on January 04, 2016. Pathology results reported by Terie Purser, RN on 12/28/2015. Electronically Signed   By: Ammie Ferrier M.D.   On: 12/28/2015 13:57   Result Date: 12/28/2015 CLINICAL DATA:  45 year old female presenting for ultrasound-guided biopsy of a left breast mass and left  axillary lymph node. EXAM: ULTRASOUND GUIDED LEFT BREAST CORE NEEDLE BIOPSY COMPARISON:  Previous exam(s). FINDINGS: I met with the patient and we discussed the procedure of ultrasound-guided biopsy, including benefits and alternatives. We discussed the high likelihood of a successful procedure. We discussed the risks of the procedure, including infection, bleeding, tissue injury, clip migration, and inadequate sampling. Informed written consent was given. The usual time-out protocol was performed immediately prior to the procedure. Using sterile technique and 1% Lidocaine as local anesthetic, under direct ultrasound visualization, a 14 gauge spring-loaded device was used to perform biopsy of a left breast mass at 6:30 using a lateral approach. At the conclusion of the procedure a ribbon shaped tissue marker clip was deployed into the biopsy cavity. Using sterile technique and 1% Lidocaine as local anesthetic, under direct ultrasound visualization, a 14 gauge spring-loaded device was used to perform biopsy of a left axillary lymph node using a lateral approach. At the conclusion of the procedure a HydroMARK tissue marker clip was deployed into the biopsy cavity. Follow up 2 view mammogram was performed and dictated separately. IMPRESSION: 1. Ultrasound guided biopsy of left breast mass at 6:30. No apparent complications. 2. Ultrasound-guided biopsy of a left axillary lymph node. No apparent complications. Electronically Signed: By: Ammie Ferrier M.D. On: 12/26/2015 10:42    ASSESSMENT & PLAN: 45 year old premenopausal Caucasian woman, presented with palpable left breast mass  1. Malignant neoplasm of lower inner quadrant of left female breast, invasive ductal carcinoma, cT2N1M0, stage IIB, ER+/PR+/HER2- --I discussed her imaging findings and biopsy pathology results in great details with patient and her husband. -She was seen by breast surgeon Dr. Excell Seltzer today, lumpectomy and sentinel lymph node biopsy  was offered to patient. -We discussed the risk of cancer recurrence after her surgery. Her breast cancer appears to be clinically aggressive, developed after a negative screening mammogram 8 months ago, high-grade, high Ki-67, with biopsy confirming positive nodes, I think her risk of recurrence is likely to be high. -I recommend neoadjuvant or adjuvant chemotherapy with dose dense Adriamycin and Cytoxan, every 2 weeks, 4 cycles, followed by weekly Taxol for 12 weeks. Potential benefit of chemotherapy, and  neuadjuvant therapy to shrink the tumor, possibility to avoid axillary lymph  node dissection if she has good response, and side effects of chemotherapy were discussed with patient and her husband in great details. Alternative chemotherapy regimen with docetaxel and Cytoxan was also discussed with her.  She is interested and agrees with new adjuvant chemotherapy. --Chemotherapy consent: Side effects including but does not not limited to, fatigue, nausea, vomiting, diarrhea, hair loss, neuropathy, fluid retention, renal and kidney dysfunction, neutropenic fever, needed for blood transfusion, bleeding,  heart failure, secondary leukemia or MDS, were discussed with patient in great detail. She agrees to proceed. -She would likely benefit from adjuvant breast radiation after lumpectomy, she was seen by a radiation oncologist Dr. Isidore Moos today -I plan to see her back and start her chemotherapy later next week.  2. Anxiety  -She has been under a lot of stress since her cancer diagnosis. -She was on Xanax before, and I reviewed for her today.  Plan -Breast MRI with and without contrast -Echocardiogram -Chemotherapy class -port placement by Dr. Excell Seltzer  -Lab, follow-up and first cycle [facility] next Friday 10/27   Orders Placed This Encounter  Procedures  . MR BREAST BILATERAL W WO CONTRAST    Standing Status:   Future    Standing Expiration Date:   03/05/2017    Order Specific Question:   If indicated  for the ordered procedure, I authorize the administration of contrast media per Radiology protocol    Answer:   Yes    Order Specific Question:   Reason for Exam (SYMPTOM  OR DIAGNOSIS REQUIRED)    Answer:   new breast cancer/ before neoadjuvant chemo    Order Specific Question:   Preferred imaging location?    Answer:   [facility] (table limit-550lbs)    Order Specific Question:   What is the patient's sedation requirement?    Answer:   No Sedation    Order Specific Question:   Does the patient have a pacemaker or implanted devices?    Answer:   No  . ECHOCARDIOGRAM COMPLETE    Standing Status:   Future    Standing Expiration Date:   04/05/2017    Order Specific Question:   Where should this test be performed    Answer:   [place]    Order Specific Question:   Complete or Limited study?    Answer:   Complete    Order Specific Question:   Does the patient have a known history of hypersensitivity to Perflutren (aka Scientist, research (medical) for echocardiograms - CHECK ALLERGIES)    Answer:   No    Order Specific Question:   ADMINISTER PERFLUTERN    Answer:   ADMINISTER PERFLUTREN    Order Specific Question:   Expected Date:    Answer:   1 week    Order Specific Question:   Reason for exam-Echo    Answer:   Chemo  V67.2 / Z09    All questions were answered. The patient knows to call the clinic with any problems, questions or concerns. I spent 55 minutes counseling the patient face to face. The total time spent in the appointment was 60 minutes and more than 50% was on counseling.     Truitt Merle, MD 01/04/2016

## 2016-01-05 ENCOUNTER — Other Ambulatory Visit: Payer: Self-pay | Admitting: Hematology

## 2016-01-05 ENCOUNTER — Other Ambulatory Visit: Payer: Self-pay | Admitting: *Deleted

## 2016-01-05 ENCOUNTER — Telehealth: Payer: Self-pay | Admitting: *Deleted

## 2016-01-05 ENCOUNTER — Telehealth: Payer: Self-pay | Admitting: Hematology

## 2016-01-05 NOTE — Telephone Encounter (Signed)
NO 10/18 LOS. MESSAGE TO MANAGED CARE RE PREAUTH FOR ECHO.

## 2016-01-05 NOTE — Telephone Encounter (Signed)
Spoke to pt concerning [place] from 01/04/16. Denies questions or concerns regarding dx or treatment care plan. Informed pt she did not have to have right mammo d/t recently had bilateral in Nov 2016. Pt relate she is going for 2nd opinion on 01/09/16 for peace of mind. Encourage pt to call with needs. Received verbal understanding. Contact information provided.

## 2016-01-06 ENCOUNTER — Ambulatory Visit (HOSPITAL_COMMUNITY)
Admission: RE | Admit: 2016-01-06 | Discharge: 2016-01-06 | Disposition: A | Discharge status: Home / Self Care | Payer: 59 | Source: Ambulatory Visit | Attending: Hematology | Admitting: Hematology

## 2016-01-06 ENCOUNTER — Ambulatory Visit (HOSPITAL_BASED_OUTPATIENT_CLINIC_OR_DEPARTMENT_OTHER)
Admission: RE | Admit: 2016-01-06 | Discharge: 2016-01-06 | Disposition: A | Discharge status: Home / Self Care | Payer: 59 | Source: Ambulatory Visit | Attending: Obstetrics and Gynecology | Admitting: Obstetrics and Gynecology

## 2016-01-06 ENCOUNTER — Ambulatory Visit (HOSPITAL_BASED_OUTPATIENT_CLINIC_OR_DEPARTMENT_OTHER)
Admission: RE | Admit: 2016-01-06 | Discharge: 2016-01-06 | Disposition: A | Discharge status: Home / Self Care | Payer: 59 | Source: Ambulatory Visit | Attending: Internal Medicine | Admitting: Internal Medicine

## 2016-01-06 VITALS — BP 96/74 | HR 73 | Ht 64.0 in | Wt 132.0 lb

## 2016-01-06 DIAGNOSIS — R590 Localized enlarged lymph nodes: Secondary | ICD-10-CM | POA: Diagnosis not present

## 2016-01-06 DIAGNOSIS — C50312 Malignant neoplasm of lower-inner quadrant of left female breast: Secondary | ICD-10-CM

## 2016-01-06 DIAGNOSIS — Z170 Estrogen receptor positive status [ER+]: Secondary | ICD-10-CM

## 2016-01-06 MED ORDER — GADOBENATE DIMEGLUMINE 529 MG/ML IV SOLN
12.0000 mL | Freq: Once | INTRAVENOUS | Status: AC | PRN
  Administered 2016-01-06: 12 mL via INTRAVENOUS

## 2016-01-06 NOTE — Progress Notes (Signed)
Echocardiogram 2D Echocardiogram has been performed.  Jeanette Macdonald 01/06/2016, 2:34 PM

## 2016-01-06 NOTE — Progress Notes (Signed)
CARDIO-ONCOLOGY CLINIC CONSULT NOTE  Referring Physician: Dr. Burr Medico  HPI:  Jeanette Macdonald is a 45 y.o. female with recently diagnosed L breast cancer who is referred by Dr. Burr Medico for enrollment into the cardio-oncology program.   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  She denies any h/o heart disease. She was previously very active but so less recently. She is very anxious about her breast cancer diagnosis. Denies CP, SOB, edema.   She is scheduled to undergo neoadjuvant chemotherapy with dose dense Adriamycin and Cytoxan, every 2 weeks, 4 cycles, followed by weekly Taxol for 12 weeks.    Echo 01/06/16 EF 35-36% normal diastolic parameters. Normal RV. Lat s' 13/1 cm/sec. GLS -21.2%    Review of Systems: [y] = yes, '[ ]'  = no   General: Weight gain '[ ]' ; Weight loss '[ ]' ; Anorexia '[ ]' ; Fatigue '[ ]' ; Fever '[ ]' ; Chills '[ ]' ; Weakness '[ ]'   Cardiac: Chest pain/pressure '[ ]' ; Resting SOB '[ ]' ; Exertional SOB '[ ]' ; Orthopnea '[ ]' ; Pedal Edema '[ ]' ; Palpitations '[ ]' ; Syncope '[ ]' ; Presyncope '[ ]' ; Paroxysmal nocturnal dyspnea'[ ]'   Pulmonary: Cough '[ ]' ; Wheezing'[ ]' ; Hemoptysis'[ ]' ; Sputum '[ ]' ; Snoring '[ ]'   GI: Vomiting'[ ]' ; Dysphagia'[ ]' ; Melena'[ ]' ; Hematochezia '[ ]' ; Heartburn'[ ]' ; Abdominal pain '[ ]' ; Constipation '[ ]' ; Diarrhea '[ ]' ; BRBPR '[ ]'   GU: Hematuria'[ ]' ; Dysuria '[ ]' ; Nocturia'[ ]'   Vascular: Pain in legs with walking '[ ]' ; Pain in feet with lying flat '[ ]' ; Non-healing sores '[ ]' ; Stroke '[ ]' ; TIA '[ ]' ; Slurred speech '[ ]' ;  Neuro: Headaches'[ ]' ; Vertigo'[ ]' ; Seizures'[ ]' ; Paresthesias'[ ]' ;Blurred vision '[ ]' ; Diplopia '[ ]' ; Vision changes '[ ]'   Ortho/Skin: Arthritis '[ ]' ; Joint pain '[ ]' ; Muscle pain '[ ]' ; Joint swelling '[ ]' ; Back Pain '[ ]' ; Rash '[ ]'   Psych: Depression'[ ]' ; Anxiety[y ]  Heme: Bleeding problems '[ ]' ; Clotting disorders '[ ]' ; Anemia '[ ]'   Endocrine: Diabetes '[ ]' ; Thyroid dysfunction'[ ]'    Past Medical  History:  Diagnosis Date  . Abscess    perianal  . Anxiety    Flying  . History of hepatitis B    20+ years ago  . Malignant neoplasm of lower-inner quadrant of left female breast ([employer]) 12/29/2015    Current Outpatient Prescriptions  Medication Sig Dispense Refill  . ALPRAZolam (XANAX) 0.5 MG tablet Take 1 tablet (0.5 mg total) by mouth at bedtime as needed for sleep or anxiety. 20 tablet 0  . aspirin EC 81 MG tablet Take 81 mg by mouth daily.    . Azelaic Acid (FINACEA) 15 % cream every evening. After skin is thoroughly washed and patted dry, gently but thoroughly massage a thin film of azelaic acid cream into the affected area twice daily, in the morning and evening.    Marland Kitchen dexamethasone (DECADRON) 4 MG tablet Take 2 tablets by mouth once a day on the day after chemotherapy and then take 2 tablets two times a day for 2 days. Take with food. 30 tablet 1  . ondansetron (ZOFRAN) 8 MG tablet Take 1 tablet (8 mg total) by mouth 2 (two) times daily as needed. Start on the third day after chemotherapy. (Patient not taking: Reported on 01/06/2016) 30 tablet 1  . prochlorperazine (COMPAZINE) 10 MG tablet Take 1 tablet (10 mg total) by mouth every 6 (six) hours as needed (Nausea  or vomiting). (Patient not taking: Reported on 01/06/2016) 30 tablet 1   No current facility-administered medications for this encounter.     Allergies  Allergen Reactions  . Penicillins Hives      Social History   Social History  . Marital status: Married    Spouse name: N/A  . Number of children: 1  . Years of education: N/A   Occupational History  . [employer]   Social History Main Topics  . Smoking status: Never Smoker  . Smokeless tobacco: Never Used  . Alcohol use No     Comment: occasionally   . Drug use: No  . Sexual activity: Yes    Birth control/ protection: Surgical     Comment: husband vasectomy    Other Topics Concern  . Not on file   Social History Narrative  . No narrative on  file      Family History  Problem Relation Age of Onset  . Cancer Maternal Grandmother     stomach  . Cancer Paternal Grandmother     lung cancer   . Cancer Maternal Aunt     liver cancer  . Cancer Maternal Grandfather     lung cancer  . Cancer Paternal Grandfather     lung cancer  . CAD Neg Hx     Vitals:   01/06/16 1509  BP: 96/74  Pulse: 73  SpO2: 99%  Weight: 132 lb (59.9 kg)  Height: '5\' 4"'  (1.626 m)    PHYSICAL EXAM: General:  Well appearing. No respiratory difficulty HEENT: normal Neck: supple. no JVD. Carotids 2+ bilat; no bruits. No lymphadenopathy or thryomegaly appreciated. Cor: PMI nondisplaced. Regular rate & rhythm. No rubs, gallops or murmurs. Lungs: clear Abdomen: soft, nontender, nondistended. No hepatosplenomegaly. No bruits or masses. Good bowel sounds. Extremities: no cyanosis, clubbing, rash, edema Neuro: alert & oriented x 3, cranial nerves grossly intact. moves all 4 extremities w/o difficulty. Affect pleasant.  ASSESSMENT & PLAN: 1. Left breast CA --ER/PR positive and HER-2 negative. --Scheduled to undergo neoadjuvant chemotherapy with dose dense Adriamycin and Cytoxan, every 2 weeks, 4 cycles, followed by weekly Taxol for 12 weeks.  --I reviewed echos personally. EF and Doppler parameters stable. No HF on exam. Will see back in 6 weeks for repeat echo to ensure stability during chemo. I explained incidence of Adriamycin cardiotoxicity in detail include small possibility of very delayed toxicity. Will also have echo at end of therapy and 1 year.    Bensimhon, Daniel,MD 5:00 PM

## 2016-01-06 NOTE — Patient Instructions (Signed)
Follow up 6 weeks with echocardiogram and appointment with Dr. Haroldine Laws.  Do the following things EVERYDAY: 1) Weigh yourself in the morning before breakfast. Write it down and keep it in a log. 2) Take your medicines as prescribed 3) Eat low salt foods-Limit salt (sodium) to 2000 mg per day.  4) Stay as active as you can everyday 5) Limit all fluids for the day to less than 2 liters

## 2016-01-08 ENCOUNTER — Other Ambulatory Visit: Payer: Self-pay | Admitting: Hematology

## 2016-01-08 DIAGNOSIS — Z170 Estrogen receptor positive status [ER+]: Principal | ICD-10-CM

## 2016-01-08 DIAGNOSIS — C50312 Malignant neoplasm of lower-inner quadrant of left female breast: Secondary | ICD-10-CM

## 2016-01-09 ENCOUNTER — Telehealth: Payer: Self-pay | Admitting: Hematology

## 2016-01-09 ENCOUNTER — Telehealth: Payer: Self-pay | Admitting: *Deleted

## 2016-01-09 NOTE — Telephone Encounter (Signed)
I called pt back and explained that why I ordered a PET scan and hope this can be done this week. I encourage her to call me back to discuss the clinical trial PREVENT which she may be eligible to participate.   Truitt Merle MD  01/09/2016 12:15 PM

## 2016-01-09 NOTE — Telephone Encounter (Signed)
10/24 appointment canceled per patient request. The patient is at [facility] and has requested that all appointments be canceled.

## 2016-01-09 NOTE — Telephone Encounter (Signed)
Call from [phone], Case number: [id number]" requesting a return call.    Called to receive this patient's name, confirmed date of birth to learn "Notification number: [id number] is approved for forty-five calendar days or will expire February 23, 2016.  This is approval for a PET scan.  This is not a guarantee of payment and depends on patient's benefit plan and provider health care contract. "

## 2016-01-10 ENCOUNTER — Telehealth: Payer: Self-pay | Admitting: *Deleted

## 2016-01-10 ENCOUNTER — Other Ambulatory Visit: Payer: 59

## 2016-01-10 NOTE — Telephone Encounter (Signed)
Spoke with patient and she informed she has decided to go to [facility] for all of her care.  Encouraged her to call with any needs or concerns or if we assist her in the future.  Patient verbalized understanding.

## 2016-01-10 NOTE — Telephone Encounter (Signed)
Error

## 2016-01-11 ENCOUNTER — Ambulatory Visit (HOSPITAL_COMMUNITY): Payer: 59

## 2016-01-13 ENCOUNTER — Ambulatory Visit: Payer: 59

## 2016-01-13 ENCOUNTER — Ambulatory Visit: Payer: 59 | Admitting: Hematology

## 2016-01-13 ENCOUNTER — Other Ambulatory Visit: Payer: 59

## 2016-01-14 ENCOUNTER — Ambulatory Visit: Payer: 59

## 2016-01-19 ENCOUNTER — Encounter: Payer: Self-pay | Admitting: *Deleted

## 2016-01-20 ENCOUNTER — Other Ambulatory Visit: Payer: 59

## 2016-01-20 ENCOUNTER — Ambulatory Visit: Payer: 59 | Admitting: Hematology

## 2016-01-20 ENCOUNTER — Ambulatory Visit: Payer: 59

## 2016-01-21 ENCOUNTER — Ambulatory Visit: Payer: 59

## 2016-01-27 ENCOUNTER — Other Ambulatory Visit: Payer: 59

## 2016-01-27 ENCOUNTER — Ambulatory Visit: Payer: 59

## 2016-01-28 ENCOUNTER — Ambulatory Visit: Payer: 59

## 2016-02-03 ENCOUNTER — Other Ambulatory Visit: Payer: 59

## 2016-02-03 ENCOUNTER — Ambulatory Visit: Payer: 59

## 2016-02-04 ENCOUNTER — Ambulatory Visit: Payer: 59

## 2016-02-08 ENCOUNTER — Other Ambulatory Visit: Payer: Self-pay | Admitting: Hematology

## 2016-02-10 ENCOUNTER — Other Ambulatory Visit: Payer: 59

## 2016-02-10 ENCOUNTER — Ambulatory Visit: Payer: 59

## 2016-02-11 ENCOUNTER — Ambulatory Visit: Payer: 59

## 2016-02-17 ENCOUNTER — Other Ambulatory Visit: Payer: 59

## 2016-02-17 ENCOUNTER — Ambulatory Visit: Payer: 59

## 2016-02-18 ENCOUNTER — Ambulatory Visit: Payer: 59

## 2016-02-23 ENCOUNTER — Encounter (HOSPITAL_COMMUNITY): Payer: 59 | Admitting: Internal Medicine

## 2016-02-23 ENCOUNTER — Ambulatory Visit (HOSPITAL_COMMUNITY): Payer: 59

## 2016-02-24 ENCOUNTER — Ambulatory Visit: Payer: 59

## 2016-02-24 ENCOUNTER — Other Ambulatory Visit: Payer: 59

## 2016-02-25 ENCOUNTER — Ambulatory Visit: Payer: 59

## 2016-03-26 ENCOUNTER — Emergency Department (HOSPITAL_COMMUNITY): Payer: 59

## 2016-03-26 ENCOUNTER — Encounter (HOSPITAL_COMMUNITY): Payer: Self-pay

## 2016-03-26 ENCOUNTER — Observation Stay (HOSPITAL_COMMUNITY)
Admission: EM | Admit: 2016-03-26 | Discharge: 2016-03-29 | Disposition: A | Discharge status: Home / Self Care | Payer: 59 | Attending: Family Medicine | Admitting: Family Medicine

## 2016-03-26 DIAGNOSIS — R509 Fever, unspecified: Secondary | ICD-10-CM | POA: Diagnosis present

## 2016-03-26 DIAGNOSIS — T451X5A Adverse effect of antineoplastic and immunosuppressive drugs, initial encounter: Secondary | ICD-10-CM

## 2016-03-26 DIAGNOSIS — C50312 Malignant neoplasm of lower-inner quadrant of left female breast: Secondary | ICD-10-CM | POA: Diagnosis present

## 2016-03-26 DIAGNOSIS — D709 Neutropenia, unspecified: Secondary | ICD-10-CM

## 2016-03-26 DIAGNOSIS — Z170 Estrogen receptor positive status [ER+]: Secondary | ICD-10-CM | POA: Diagnosis not present

## 2016-03-26 DIAGNOSIS — D702 Other drug-induced agranulocytosis: Secondary | ICD-10-CM

## 2016-03-26 DIAGNOSIS — Z7952 Long term (current) use of systemic steroids: Secondary | ICD-10-CM | POA: Diagnosis not present

## 2016-03-26 DIAGNOSIS — R5081 Fever presenting with conditions classified elsewhere: Secondary | ICD-10-CM

## 2016-03-26 DIAGNOSIS — D701 Agranulocytosis secondary to cancer chemotherapy: Secondary | ICD-10-CM

## 2016-03-26 DIAGNOSIS — C50919 Malignant neoplasm of unspecified site of unspecified female breast: Secondary | ICD-10-CM | POA: Diagnosis present

## 2016-03-26 DIAGNOSIS — J101 Influenza due to other identified influenza virus with other respiratory manifestations: Secondary | ICD-10-CM | POA: Diagnosis not present

## 2016-03-26 DIAGNOSIS — A419 Sepsis, unspecified organism: Secondary | ICD-10-CM | POA: Diagnosis not present

## 2016-03-26 DIAGNOSIS — R6510 Systemic inflammatory response syndrome (SIRS) of non-infectious origin without acute organ dysfunction: Secondary | ICD-10-CM | POA: Diagnosis present

## 2016-03-26 LAB — URINALYSIS, ROUTINE W REFLEX MICROSCOPIC
Bilirubin Urine: NEGATIVE
GLUCOSE, UA: NEGATIVE mg/dL
HGB URINE DIPSTICK: NEGATIVE
Ketones, ur: NEGATIVE mg/dL
NITRITE: NEGATIVE
PH: 7 (ref 5.0–8.0)
PROTEIN: NEGATIVE mg/dL
SPECIFIC GRAVITY, URINE: 1.016 (ref 1.005–1.030)

## 2016-03-26 LAB — INFLUENZA PANEL BY PCR (TYPE A & B)
INFLBPCR: NEGATIVE
Influenza A By PCR: POSITIVE — AB

## 2016-03-26 LAB — COMPREHENSIVE METABOLIC PANEL
ALBUMIN: 3.2 g/dL — AB (ref 3.5–5.0)
ALK PHOS: 75 U/L (ref 38–126)
ALT: 34 U/L (ref 14–54)
AST: 22 U/L (ref 15–41)
Anion gap: 5 (ref 5–15)
BILIRUBIN TOTAL: 0.8 mg/dL (ref 0.3–1.2)
BUN: 19 mg/dL (ref 6–20)
CALCIUM: 8.3 mg/dL — AB (ref 8.9–10.3)
CO2: 29 mmol/L (ref 22–32)
Chloride: 100 mmol/L — ABNORMAL LOW (ref 101–111)
Creatinine, Ser: 0.52 mg/dL (ref 0.44–1.00)
GFR calc Af Amer: >60 mL/min (ref >60)
GFR calc non Af Amer: >60 mL/min (ref >60)
GLUCOSE: 108 mg/dL — AB (ref 65–99)
POTASSIUM: 4 mmol/L (ref 3.5–5.1)
SODIUM: 134 mmol/L — AB (ref 135–145)
TOTAL PROTEIN: 5.7 g/dL — AB (ref 6.5–8.1)

## 2016-03-26 LAB — CBC WITH DIFFERENTIAL/PLATELET
BASOS PCT: 2 %
Basophils Absolute: 0 10*3/uL (ref 0.0–0.1)
EOS ABS: 0 10*3/uL (ref 0.0–0.7)
Eosinophils Relative: 1 %
HCT: 31.5 % — ABNORMAL LOW (ref 36.0–46.0)
HEMOGLOBIN: 10.7 g/dL — AB (ref 12.0–15.0)
LYMPHS PCT: 14 %
Lymphs Abs: 0.2 10*3/uL — ABNORMAL LOW (ref 0.7–4.0)
MCH: 31.3 pg (ref 26.0–34.0)
MCHC: 34 g/dL (ref 30.0–36.0)
MCV: 92.1 fL (ref 78.0–100.0)
Monocytes Absolute: 0 10*3/uL — ABNORMAL LOW (ref 0.1–1.0)
Monocytes Relative: 1 %
NEUTROS ABS: 1.2 10*3/uL — AB (ref 1.7–7.7)
NEUTROS PCT: 82 %
Platelets: 99 10*3/uL — ABNORMAL LOW (ref 150–400)
RBC: 3.42 MIL/uL — ABNORMAL LOW (ref 3.87–5.11)
RDW: 15.6 % — ABNORMAL HIGH (ref 11.5–15.5)
WBC MORPHOLOGY: INCREASED
WBC: 1.4 10*3/uL — CL (ref 4.0–10.5)

## 2016-03-26 LAB — PROTIME-INR
INR: 1.14
PROTHROMBIN TIME: 14.7 s (ref 11.4–15.2)

## 2016-03-26 LAB — POC URINE PREG, ED: PREG TEST UR: NEGATIVE

## 2016-03-26 LAB — I-STAT BETA HCG BLOOD, ED (MC, WL, AP ONLY): I-stat hCG, quantitative: 5.2 m[IU]/mL — ABNORMAL HIGH (ref <5)

## 2016-03-26 LAB — I-STAT CG4 LACTIC ACID, ED: Lactic Acid, Venous: 1.19 mmol/L (ref 0.5–1.9)

## 2016-03-26 LAB — HCG, QUANTITATIVE, PREGNANCY: HCG, BETA CHAIN, QUANT, S: 4 m[IU]/mL (ref <5)

## 2016-03-26 MED ORDER — SODIUM CHLORIDE 0.9 % IV BOLUS (SEPSIS)
1000.0000 mL | Freq: Once | INTRAVENOUS | Status: AC
Start: 2016-03-26 — End: 2016-03-26
  Administered 2016-03-26: 1000 mL via INTRAVENOUS

## 2016-03-26 MED ORDER — GUAIFENESIN ER 600 MG PO TB12
600.0000 mg | ORAL_TABLET | Freq: Two times a day (BID) | ORAL | Status: DC
  Administered 2016-03-26 – 2016-03-28 (×5): 600 mg via ORAL
  Filled 2016-03-26 (×6): qty 1

## 2016-03-26 MED ORDER — SODIUM CHLORIDE 0.9 % IV BOLUS (SEPSIS)
1000.0000 mL | Freq: Once | INTRAVENOUS | Status: AC
  Administered 2016-03-26: 1000 mL via INTRAVENOUS

## 2016-03-26 MED ORDER — PANTOPRAZOLE SODIUM 40 MG PO TBEC
40.0000 mg | DELAYED_RELEASE_TABLET | ORAL | Status: DC
  Administered 2016-03-27 – 2016-03-29 (×3): 40 mg via ORAL
  Filled 2016-03-26 (×3): qty 1

## 2016-03-26 MED ORDER — ONDANSETRON HCL 4 MG/2ML IJ SOLN: 4.0000 mg | Freq: Four times a day (QID) | INTRAMUSCULAR | Status: DC | PRN

## 2016-03-26 MED ORDER — VANCOMYCIN HCL IN DEXTROSE 750-5 MG/150ML-% IV SOLN
750.0000 mg | Freq: Two times a day (BID) | INTRAVENOUS | Status: DC
  Administered 2016-03-27: 750 mg via INTRAVENOUS
  Filled 2016-03-26: qty 150

## 2016-03-26 MED ORDER — ONDANSETRON HCL 4 MG PO TABS: 4.0000 mg | ORAL_TABLET | Freq: Four times a day (QID) | ORAL | Status: DC | PRN

## 2016-03-26 MED ORDER — SODIUM CHLORIDE 0.9 % IV SOLN
INTRAVENOUS | Status: DC
  Administered 2016-03-26: 21:00:00 via INTRAVENOUS

## 2016-03-26 MED ORDER — DEXTROSE 5 % IV SOLN
2.0000 g | Freq: Once | INTRAVENOUS | Status: AC
  Administered 2016-03-26: 2 g via INTRAVENOUS
  Filled 2016-03-26: qty 2

## 2016-03-26 MED ORDER — SODIUM CHLORIDE 0.9% FLUSH
3.0000 mL | Freq: Two times a day (BID) | INTRAVENOUS | Status: DC
  Administered 2016-03-27 – 2016-03-29 (×4): 3 mL via INTRAVENOUS

## 2016-03-26 MED ORDER — ALPRAZOLAM 0.5 MG PO TABS: 0.5000 mg | ORAL_TABLET | Freq: Every evening | ORAL | Status: DC | PRN

## 2016-03-26 MED ORDER — ALBUTEROL SULFATE (2.5 MG/3ML) 0.083% IN NEBU: 2.5000 mg | INHALATION_SOLUTION | RESPIRATORY_TRACT | Status: DC | PRN

## 2016-03-26 MED ORDER — ACETAMINOPHEN 325 MG PO TABS: 650.0000 mg | ORAL_TABLET | Freq: Four times a day (QID) | ORAL | Status: DC | PRN

## 2016-03-26 MED ORDER — ACETAMINOPHEN 650 MG RE SUPP: 650.0000 mg | Freq: Four times a day (QID) | RECTAL | Status: DC | PRN

## 2016-03-26 MED ORDER — DEXTROSE 5 % IV SOLN
2.0000 g | Freq: Three times a day (TID) | INTRAVENOUS | Status: DC
  Administered 2016-03-26 – 2016-03-27 (×2): 2 g via INTRAVENOUS
  Filled 2016-03-26 (×2): qty 2

## 2016-03-26 MED ORDER — VANCOMYCIN HCL IN DEXTROSE 1-5 GM/200ML-% IV SOLN
1000.0000 mg | Freq: Once | INTRAVENOUS | Status: AC
  Administered 2016-03-26: 1000 mg via INTRAVENOUS
  Filled 2016-03-26: qty 200

## 2016-03-26 MED ORDER — HYDROCODONE-ACETAMINOPHEN 5-325 MG PO TABS: 1.0000 | ORAL_TABLET | ORAL | Status: DC | PRN

## 2016-03-26 NOTE — ED Notes (Signed)
Main lab verbalizes will add on hcg quantitative.

## 2016-03-26 NOTE — ED Notes (Signed)
Pt transported to DG.  

## 2016-03-26 NOTE — ED Provider Notes (Signed)
[facility] DEPT Provider Note   CSN: [id number] Arrival date & time: 03/26/16  1522     History   Chief Complaint Chief Complaint  Patient presents with  . CA Pt  . Fever  . Cough  . Dizziness    HPI Jeanette Macdonald is a 46 y.o. female.  HPI   Started with cough three days ago, productive of green sputum. 100.3/100.4,  Today 100.7 and told to come into ED by [facility].  Seeing them for Breast Ca, last chemo 1/3.   Congestion  Body aches, knees feet, lower back.  Taking Neulasta.   No known sick contacts   Past Medical History:  Diagnosis Date  . Abscess    perianal  . Anxiety    Flying  . History of hepatitis B    20+ years ago  . Malignant neoplasm of lower-inner quadrant of left female breast ([place]) 12/29/2015    Patient Active Problem List   Diagnosis Date Noted  . Breast cancer ([place]) 03/26/2016  . SIRS (systemic inflammatory response syndrome) ([place]) 03/26/2016  . Malignant neoplasm of lower-inner quadrant of left female breast ([place]) 12/29/2015  . Palpitations 01/06/2014    Past Surgical History:  Procedure Laterality Date  . CESAREAN SECTION  02/02/08    OB History    Gravida Para Term Preterm AB Living   3 1       1    SAB TAB Ectopic Multiple Live Births                   Home Medications    Prior to Admission medications   Medication Sig Start Date End Date Taking? Authorizing Provider  ALPRAZolam Duanne Moron) 0.5 MG tablet Take 1 tablet (0.5 mg total) by mouth at bedtime as needed for sleep or anxiety. 01/04/16  Yes Truitt Merle, MD  Azelaic Acid (FINACEA) 15 % cream every evening. After skin is thoroughly washed and patted dry, gently but thoroughly massage a thin film of azelaic acid cream into the affected area twice daily, in the morning and evening.   Yes Historical Provider, MD  dexamethasone (DECADRON) 4 MG tablet Take 2 tablets by mouth once a day on the day after chemotherapy and then take 2 tablets two times a day for 2 days. Take with  food. Patient taking differently: Take 8 mg by mouth See admin instructions. Take 2 tablets by mouth twice daily the day before chemo, then 2 tablets the day of chemo, and 2 tablets twice daily for 2 days after chemotherapy 01/04/16  Yes Truitt Merle, MD  ondansetron (ZOFRAN) 8 MG tablet Take 1 tablet (8 mg total) by mouth 2 (two) times daily as needed. Start on the third day after chemotherapy. 01/04/16  Yes Truitt Merle, MD  pantoprazole (PROTONIX) 20 MG tablet Take 40 mg by mouth every morning. 03/05/16  Yes Historical Provider, MD  prochlorperazine (COMPAZINE) 10 MG tablet Take 1 tablet (10 mg total) by mouth every 6 (six) hours as needed (Nausea or vomiting). 01/04/16  Yes Truitt Merle, MD    Family History Family History  Problem Relation Age of Onset  . Cancer Maternal Grandmother     stomach  . Cancer Paternal Grandmother     lung cancer   . Cancer Maternal Aunt     liver cancer  . Cancer Maternal Grandfather     lung cancer  . Cancer Paternal Grandfather     lung cancer  . CAD Neg Hx     Social History  Social History  Substance Use Topics  . Smoking status: Never Smoker  . Smokeless tobacco: Never Used  . Alcohol use No     Comment: occasionally      Allergies   Docetaxel and Penicillins   Review of Systems Review of Systems  Constitutional: Positive for appetite change (not taking in much fluids) and fatigue. Negative for fever.  HENT: Positive for congestion and sore throat.   Eyes: Negative for visual disturbance.  Respiratory: Positive for cough. Negative for shortness of breath.   Cardiovascular: Negative for chest pain.  Gastrointestinal: Negative for abdominal pain, diarrhea, nausea and vomiting.  Genitourinary: Negative for difficulty urinating and dysuria.  Musculoskeletal: Positive for arthralgias and myalgias. Negative for back pain and neck pain.  Skin: Negative for rash.  Neurological: Positive for light-headedness. Negative for syncope and headaches.      Physical Exam Updated Vital Signs BP (!) 94/55 (BP Location: Left Arm)   Pulse 99   Temp 98.7 F (37.1 C) (Oral)   Resp 16   Ht 5\' 4"  (1.626 m)   Wt 139 lb 1.8 oz (63.1 kg)   LMP 02/10/2016 Comment: irregular d/t chemo  SpO2 100%   BMI 23.88 kg/m   Physical Exam  Constitutional: She is oriented to person, place, and time. She appears well-developed and well-nourished. No distress.  HENT:  Head: Normocephalic and atraumatic.  Eyes: Conjunctivae and EOM are normal.  Neck: Normal range of motion.  Cardiovascular: Normal rate, regular rhythm, normal heart sounds and intact distal pulses.  Exam reveals no gallop and no friction rub.   No murmur heard. Pulmonary/Chest: Effort normal and breath sounds normal. No respiratory distress. She has no wheezes. She has no rales.  Abdominal: Soft. She exhibits no distension. There is no tenderness. There is no guarding.  Musculoskeletal: She exhibits no edema or tenderness.  Neurological: She is alert and oriented to person, place, and time.  Skin: Skin is warm and dry. No rash noted. She is not diaphoretic. No erythema.  Nursing note and vitals reviewed.    ED Treatments / Results  Labs (all labs ordered are listed, but only abnormal results are displayed) Labs Reviewed  COMPREHENSIVE METABOLIC PANEL - Abnormal; Notable for the following:       Result Value   Sodium 134 (*)    Chloride 100 (*)    Glucose, Bld 108 (*)    Calcium 8.3 (*)    Total Protein 5.7 (*)    Albumin 3.2 (*)    All other components within normal limits  CBC WITH DIFFERENTIAL/PLATELET - Abnormal; Notable for the following:    WBC 1.4 (*)    RBC 3.42 (*)    Hemoglobin 10.7 (*)    HCT 31.5 (*)    RDW 15.6 (*)    Platelets 99 (*)    Neutro Abs 1.2 (*)    Lymphs Abs 0.2 (*)    Monocytes Absolute 0.0 (*)    All other components within normal limits  URINALYSIS, ROUTINE W REFLEX MICROSCOPIC - Abnormal; Notable for the following:    APPearance HAZY (*)     Leukocytes, UA TRACE (*)    Bacteria, UA RARE (*)    Squamous Epithelial / LPF 0-5 (*)    All other components within normal limits  INFLUENZA PANEL BY PCR (TYPE A & B, H1N1) - Abnormal; Notable for the following:    Influenza A By PCR POSITIVE (*)    All other components within normal limits  COMPREHENSIVE  METABOLIC PANEL - Abnormal; Notable for the following:    Glucose, Bld 124 (*)    Calcium 8.2 (*)    Total Protein 5.2 (*)    Albumin 3.0 (*)    All other components within normal limits  CBC - Abnormal; Notable for the following:    WBC 0.3 (*)    RBC 3.01 (*)    Hemoglobin 9.5 (*)    HCT 27.7 (*)    Platelets 66 (*)    All other components within normal limits  I-STAT BETA HCG BLOOD, ED ([facility], [facility], [facility] ONLY) - Abnormal; Notable for the following:    I-stat hCG, quantitative 5.2 (*)    All other components within normal limits  CULTURE, BLOOD (ROUTINE X 2)  CULTURE, BLOOD (ROUTINE X 2)  URINE CULTURE  PROTIME-INR  HCG, QUANTITATIVE, PREGNANCY  MAGNESIUM  PHOSPHORUS  TSH  I-STAT CG4 LACTIC ACID, ED  POC URINE PREG, ED    EKG  EKG Interpretation  Date/Time:  Monday March 26 2016 16:30:45 EST Ventricular Rate:  90 PR Interval:    QRS Duration: 103 QT Interval:  348 QTC Calculation: 426 R Axis:   7 Text Interpretation:  Sinus rhythm Abnormal R-wave progression, early transition since last tracing no significant change Confirmed by BELFI  MD, MELANIE ([id number]) on 03/26/2016 4:37:44 PM       Radiology Dg Chest 2 View  Result Date: 03/26/2016 CLINICAL DATA:  Productive cough, fever, history of breast cancer EXAM: CHEST  2 VIEW COMPARISON:  10/23/2013 FINDINGS: The heart size and mediastinal contours are within normal limits. Both lungs are clear. The visualized skeletal structures are unremarkable. IMPRESSION: No active cardiopulmonary disease. Electronically Signed   By: Lahoma Crocker M.D.   On: 03/26/2016 16:10    Procedures Procedures (including critical care  time)  Medications Ordered in ED Medications  vancomycin (VANCOCIN) IVPB 750 mg/150 ml premix (750 mg Intravenous Given 03/27/16 0621)  ceFEPIme (MAXIPIME) 2 g in dextrose 5 % 50 mL IVPB (2 g Intravenous Given 03/27/16 0844)  pantoprazole (PROTONIX) EC tablet 40 mg (40 mg Oral Given 03/27/16 0621)  ALPRAZolam (XANAX) tablet 0.5 mg (not administered)  sodium chloride flush (NS) 0.9 % injection 3 mL (3 mLs Intravenous Not Given 03/27/16 0845)  acetaminophen (TYLENOL) tablet 650 mg (not administered)    Or  acetaminophen (TYLENOL) suppository 650 mg (not administered)  HYDROcodone-acetaminophen (NORCO/VICODIN) 5-325 MG per tablet 1-2 tablet (not administered)  ondansetron (ZOFRAN) tablet 4 mg (not administered)    Or  ondansetron (ZOFRAN) injection 4 mg (not administered)  0.9 %  sodium chloride infusion ( Intravenous Stopped 03/27/16 0844)  guaiFENesin (MUCINEX) 12 hr tablet 600 mg (600 mg Oral Given 03/27/16 0844)  albuterol (PROVENTIL) (2.5 MG/3ML) 0.083% nebulizer solution 2.5 mg (not administered)  oseltamivir (TAMIFLU) capsule 75 mg (not administered)  0.9 %  sodium chloride infusion (not administered)  sodium chloride 0.9 % bolus 1,000 mL (0 mLs Intravenous Stopped 03/26/16 1630)    And  sodium chloride 0.9 % bolus 1,000 mL (0 mLs Intravenous Stopped 03/26/16 1807)  ceFEPIme (MAXIPIME) 2 g in dextrose 5 % 50 mL IVPB (0 g Intravenous Stopped 03/26/16 1656)  vancomycin (VANCOCIN) IVPB 1000 mg/200 mL premix (0 mg Intravenous Stopped 03/26/16 1807)   CRITICAL CARE sepsis Performed by: Alvino Chapel   Total critical care time: 30 minutes  Critical care time was exclusive of separately billable procedures and treating other patients.  Critical care was necessary to treat or prevent imminent or  life-threatening deterioration.  Critical care was time spent personally by me on the following activities: development of treatment plan with patient and/or surrogate as well as nursing,  discussions with consultants, evaluation of patient's response to treatment, examination of patient, obtaining history from patient or surrogate, ordering and performing treatments and interventions, ordering and review of laboratory studies, ordering and review of radiographic studies, pulse oximetry and re-evaluation of patient's condition.   Initial Impression / Assessment and Plan / ED Course  I have reviewed the triage vital signs and the nursing notes.  Pertinent labs & imaging results that were available during my care of the patient were reviewed by me and considered in my medical decision making (see chart for details).  46 year old female with a history of breast cancer last chemotherapy January 3 presents with concern for cough, congestion and fever at home.  Patient's initial blood pressure on arrival to the emergency department is recorded at 68/52, and rapidly improved to the 123XX123 systolic. Patient reports that she has low blood pressure at baseline systolic blood pressures in the 90s, and outpatient records in December confirmed this. Given her low blood pressures in the setting of fever and immunocompromise, patient was called code sepsis, 30 mL/kg normal saline was given as well as vancomycin and cefepime for concern of possible HCAP by history.  With fluid resuscitation, patient improved to baseline BP in upper 0000000, low 123XX123 systolic, feeling improved.  Pt with [place] 1200. Will admit for continued care of sepsis, influenza-like illness with influenza panel pending.   Final Clinical Impressions(s) / ED Diagnoses   Final diagnoses:  Influenza A  Chemotherapy-induced neutropenia ([place])  Sepsis, due to unspecified organism [facility])    New Prescriptions Current Discharge Medication List       Gareth Morgan, MD 03/27/16 [phone]

## 2016-03-26 NOTE — Progress Notes (Addendum)
Pharmacy Antibiotic Note  Jeanette Macdonald is a 46 y.o. female with PMH of breast cancer with last chemotherapy at [facility] on 03/21/2016 admitted on 03/26/2016 with sepsis secondary to HCAP. Patient noted to be febrile and neutropenic in the ED. Pharmacy has been consulted for Vancomycin and Cefepime dosing. First doses already ordered per ED provider. Of note, patient has penicillin allergy causing hives, but RN reports patient tolerated Cefepime dose in the ED without issue.  Plan: Vancomycin 1g IV x 1 per ED provider, then 750mg  IV q12h.  Plan for Vancomycin trough level at steady state. Goal trough level 15-20 mcg/mL.  Cefepime 2g IV q8h. Monitor renal function, cultures, clinical course.   Height: 5\' 4"  (162.6 cm) Weight: 132 lb (59.9 kg) IBW/kg (Calculated) : 54.7  Temp (24hrs), Avg:100.1 F (37.8 C), Min:99.9 F (37.7 C), Max:100.2 F (37.9 C)   Recent Labs Lab 03/26/16 1556 03/26/16 1607  WBC 1.4*  --   CREATININE 0.52  --   LATICACIDVEN  --  1.19    Estimated Creatinine Clearance: 75.1 mL/min (by C-G formula based on SCr of 0.52 mg/dL).    Allergies  Allergen Reactions  . Docetaxel     Other reaction(s): Other (See Comments) SOB, chest tightness  . Penicillins Hives    Antimicrobials this admission: 1/8 >> Vancomycin >> 1/8 >> Cefepime >>  Dose adjustments this admission: --  Microbiology results: 1/8 BCx: sent 1/8 UCx: sent    Thank you for allowing pharmacy to be a part of this patient's care.    Lindell Spar, PharmD, BCPS Pager: [phone] 03/26/2016 5:44 PM

## 2016-03-26 NOTE — H&P (Signed)
Jeanette Macdonald [id number] DOB: October 12, 1970 DOA: 03/26/2016     PCP: No PCP Per Patient   Outpatient Specialists: [facility]  Patient coming from:  home Lives   With family    Chief Complaint: fever cough  HPI: Evans Cota is a 46 y.o. female with medical history significant of breast cancer and GERD    Presented with 3 day history of cold type of symptoms consistent of runny nose, sore throat, cough and chest congestion low-grade fever up to 100.8. She  endorses lightheadedness today this has not been associated with chest pain no nausea or vomiting earache she denies any sick contacts. She  endorses body aches but overall reports feeling like she just got a cold nothing more. Last chemotherapy was on January 3 states that she had Neulasta. Reports good PO intake no diarrhea.  Reports that her blood pressure usually runs in the low 100s.  Regarding pertinent Chronic problems: re Breast cancer issue today diagnosed in October 2017 she is eating treated at [facility] last chemotherapy was January 3 she is in the third cycle of TAC   IN ER:  Temp (24hrs), Avg:100.1 F (37.8 C), Min:99.9 F (37.7 C), Max:100.2 F (37.9 C)     Initially on arrival to emergency department hypotensive with systolic style to 68 but rapidly improved code sepsis was initiated given fever and low white blood cell count patient was started on vancomycin and cefepime cultures were obtained  RR 15 98% on room air current blood pressure 105/75 states it's her baseline Actiq acid 1.19 i-STAT hCG 5.2 urine pregnancy test negative  WBC 1.4 absolute neutrophil, 1.2 hemoglobin 10.7 PLT 99 Chest x-ray negative Following Medications were ordered in ER: Medications  vancomycin (VANCOCIN) IVPB 750 mg/150 ml premix (not administered)  ceFEPIme (MAXIPIME) 2 g in dextrose 5 % 50 mL IVPB (not administered)  sodium chloride 0.9 % bolus 1,000 mL (0 mLs Intravenous Stopped 03/26/16 1630)    And  sodium chloride 0.9 %  bolus 1,000 mL (0 mLs Intravenous Stopped 03/26/16 1807)  ceFEPIme (MAXIPIME) 2 g in dextrose 5 % 50 mL IVPB (0 g Intravenous Stopped 03/26/16 1656)  vancomycin (VANCOCIN) IVPB 1000 mg/200 mL premix (0 mg Intravenous Stopped 03/26/16 1807)      Hospitalist was called for admission for sepsis suspected viral illness  Review of Systems:    Pertinent positives include:  Fevers, chills, fatigue, weight loss   productive cough,  joint pain  Constitutional:  No weight loss, night sweats,HEENT:  No headaches, Difficulty swallowing,Tooth/dental problems,Sore throat,  No sneezing, itching, ear ache, nasal congestion, post nasal drip,  Cardio-vascular:  No chest pain, Orthopnea, PND, anasarca, dizziness, palpitations.no Bilateral lower extremity swelling  GI:  No heartburn, indigestion, abdominal pain, nausea, vomiting, diarrhea, change in bowel habits, loss of appetite, melena, blood in stool, hematemesis Resp:  no shortness of breath at rest. No dyspnea on exertion, No excess mucus, no  No No coughing up of blood.No change in color of mucus.No wheezing. Skin:  no rash or lesions. No jaundice GU:  no dysuria, change in color of urine, no urgency or frequency. No straining to urinate.  No flank pain.  Musculoskeletal:  No or no joint swelling. No decreased range of motion. No back pain.  Psych:  No change in mood or affect. No depression or anxiety. No memory loss.  Neuro: no localizing neurological complaints, no tingling, no weakness, no double vision, no gait abnormality, no slurred speech, no confusion  As  per HPI otherwise 10 point review of systems negative.   Past Medical History: Past Medical History:  Diagnosis Date  . Abscess    perianal  . Anxiety    Flying  . History of hepatitis B    20+ years ago  . Malignant neoplasm of lower-inner quadrant of left female breast ([place]) 12/29/2015   Past Surgical History:  Procedure Laterality Date  . CESAREAN SECTION  02/02/08      Social History:  Ambulatory independently     reports that she has never smoked. She has never used smokeless tobacco. She reports that she does not drink alcohol or use drugs.  Allergies:   Allergies  Allergen Reactions  . Docetaxel     Other reaction(s): Other (See Comments) SOB, chest tightness  . Penicillins Hives       Family History:  Family History  Problem Relation Age of Onset  . Cancer Maternal Grandmother     stomach  . Cancer Paternal Grandmother     lung cancer   . Cancer Maternal Aunt     liver cancer  . Cancer Maternal Grandfather     lung cancer  . Cancer Paternal Grandfather     lung cancer  . CAD Neg Hx     Medications: Prior to Admission medications   Medication Sig Start Date End Date Taking? Authorizing Provider  ALPRAZolam Duanne Moron) 0.5 MG tablet Take 1 tablet (0.5 mg total) by mouth at bedtime as needed for sleep or anxiety. 01/04/16  Yes Truitt Merle, MD  Azelaic Acid (FINACEA) 15 % cream every evening. After skin is thoroughly washed and patted dry, gently but thoroughly massage a thin film of azelaic acid cream into the affected area twice daily, in the morning and evening.   Yes Historical Provider, MD  dexamethasone (DECADRON) 4 MG tablet Take 2 tablets by mouth once a day on the day after chemotherapy and then take 2 tablets two times a day for 2 days. Take with food. Patient taking differently: Take 8 mg by mouth See admin instructions. Take 2 tablets by mouth twice daily the day before chemo, then 2 tablets the day of chemo, and 2 tablets twice daily for 2 days after chemotherapy 01/04/16  Yes Truitt Merle, MD  ondansetron (ZOFRAN) 8 MG tablet Take 1 tablet (8 mg total) by mouth 2 (two) times daily as needed. Start on the third day after chemotherapy. 01/04/16  Yes Truitt Merle, MD  pantoprazole (PROTONIX) 20 MG tablet Take 40 mg by mouth every morning. 03/05/16  Yes Historical Provider, MD  prochlorperazine (COMPAZINE) 10 MG tablet Take 1 tablet  (10 mg total) by mouth every 6 (six) hours as needed (Nausea or vomiting). 01/04/16  Yes Truitt Merle, MD    Physical Exam: Patient Vitals for the past 24 hrs:  BP Temp Temp src Pulse Resp SpO2 Height Weight  03/26/16 1800 97/65 - - 94 15 98 % - -  03/26/16 1729 100/66 - - 93 16 97 % - -  03/26/16 1656 92/64 99.9 F (37.7 C) Oral 99 21 100 % - -  03/26/16 1631 90/64 - - 92 20 100 % - -  03/26/16 1628 - - - - - - 5\' 4"  (1.626 m) 59.9 kg (132 lb)  03/26/16 1627 92/65 - - 92 18 100 % - -  03/26/16 1553 (!) 94/54 100.1 F (37.8 C) Oral 90 14 97 % - -  03/26/16 1527 (!) 68/52 100.2 F (37.9 C) Oral 85 18 96 % - -  1. General:  in No Acute distress 2. Psychological: Alert and   Oriented 3. Head/ENT:     Dry Mucous Membranes                          Head Non traumatic, neck supple                          Normal   Dentition 4. SKIN:   decreased Skin turgor,  Skin clean Dry and intact no rash 5. Heart: Regular rate and rhythm no  Murmur, Rub or gallop 6. Lungs:   no wheezes some crackles   7. Abdomen: Soft,  non-tender, Non distended 8. Lower extremities: no clubbing, cyanosis, or edema 9. Neurologically Grossly intact, moving all 4 extremities equally  10. MSK: Normal range of motion   body mass index is 22.66 kg/m.  Labs on Admission:   Labs on Admission: I have personally reviewed following labs and imaging studies  CBC:  Recent Labs Lab 03/26/16 1556  WBC 1.4*  NEUTROABS 1.2*  HGB 10.7*  HCT 31.5*  MCV 92.1  PLT 99*   Basic Metabolic Panel:  Recent Labs Lab 03/26/16 1556  NA 134*  K 4.0  CL 100*  CO2 29  GLUCOSE 108*  BUN 19  CREATININE 0.52  CALCIUM 8.3*   GFR: Estimated Creatinine Clearance: 75.1 mL/min (by C-G formula based on SCr of 0.52 mg/dL). Liver Function Tests:  Recent Labs Lab 03/26/16 1556  AST 22  ALT 34  ALKPHOS 75  BILITOT 0.8  PROT 5.7*  ALBUMIN 3.2*   No results for input(s): LIPASE, AMYLASE in the last 168 hours. No results  for input(s): AMMONIA in the last 168 hours. Coagulation Profile:  Recent Labs Lab 03/26/16 1556  INR 1.14   Cardiac Enzymes: No results for input(s): CKTOTAL, CKMB, CKMBINDEX, TROPONINI in the last 168 hours. BNP (last 3 results) No results for input(s): PROBNP in the last 8760 hours. HbA1C: No results for input(s): HGBA1C in the last 72 hours. CBG: No results for input(s): GLUCAP in the last 168 hours. Lipid Profile: No results for input(s): CHOL, HDL, LDLCALC, TRIG, CHOLHDL, LDLDIRECT in the last 72 hours. Thyroid Function Tests: No results for input(s): TSH, T4TOTAL, FREET4, T3FREE, THYROIDAB in the last 72 hours. Anemia Panel: No results for input(s): VITAMINB12, FOLATE, FERRITIN, TIBC, IRON, RETICCTPCT in the last 72 hours. Urine analysis:    Component Value Date/Time   COLORURINE YELLOW 03/26/2016 1557   APPEARANCEUR HAZY (A) 03/26/2016 1557   LABSPEC 1.016 03/26/2016 1557   PHURINE 7.0 03/26/2016 1557   GLUCOSEU NEGATIVE 03/26/2016 1557   HGBUR NEGATIVE 03/26/2016 1557   BILIRUBINUR NEGATIVE 03/26/2016 1557   KETONESUR NEGATIVE 03/26/2016 1557   PROTEINUR NEGATIVE 03/26/2016 1557   UROBILINOGEN 1.0 03/30/2010 2228   NITRITE NEGATIVE 03/26/2016 1557   LEUKOCYTESUR TRACE (A) 03/26/2016 1557   Sepsis Labs: @LABRCNTIP (procalcitonin:4,lacticidven:4) )No results found for this or any previous visit (from the past 240 hour(s)).     UA   no evidence of UTI   No results found for: HGBA1C  Estimated Creatinine Clearance: 75.1 mL/min (by C-G formula based on SCr of 0.52 mg/dL).  BNP (last 3 results) No results for input(s): PROBNP in the last 8760 hours.   ECG REPORT  Independently reviewed Rate:90  Rhythm: NSR ST&T Change: No acute ischemic changes   [id number]  Filed Weights   03/26/16 1628  Weight: 59.9 kg (  132 lb)     Cultures: No results found for: [place], [place], CULT, REPTSTATUS   Radiological Exams on Admission: Dg Chest 2 View  Result Date:  03/26/2016 CLINICAL DATA:  Productive cough, fever, history of breast cancer EXAM: CHEST  2 VIEW COMPARISON:  10/23/2013 FINDINGS: The heart size and mediastinal contours are within normal limits. Both lungs are clear. The visualized skeletal structures are unremarkable. IMPRESSION: No active cardiopulmonary disease. Electronically Signed   By: Lahoma Crocker M.D.   On: 03/26/2016 16:10    Chart has been reviewed    Assessment/Plan 46 y.o. female with medical history significant of breast cancer Or known chemotherapy Admitted with sepsis secondary to bilateral illness associated with low white blood cell count  Present on Admission:   . Sepsis/SIRS ([place])  given the leukopenia and fever associated hypertension currently improving continue aggressive resuscitation with results of blood cultures and influenza panel continue broad-spectrum antibiotics given leukopenia  . Breast cancer [facility]) will need to continue to follow up with oncology as an outpatient Other plan as per orders.  DVT prophylaxis:  SCD      Code Status:  FULL CODE   as per patient    Family Communication:   Family not  at  Bedside    Disposition Plan:    To home once workup is complete and patient is stable                             Consults called: none  Admission status:   obs expected speedy recovery   Level of care   tele  observe given transient hypotension     I have spent a total of 57 min on this admission   Jailin Moomaw 03/26/2016, 7:09 PM   [facility]  Pager [phone]   after 2 AM please page floor coverage PA If 7AM-7PM, please contact the day team taking care of the patient  [email or web address]  Password [email or web address]

## 2016-03-26 NOTE — ED Notes (Signed)
BOTH SETS OF BLOOD CULTURES HAVE BEEN DRAWN.

## 2016-03-26 NOTE — ED Triage Notes (Signed)
Pt c/o productive cough and fever x 3 days and lightheadedness starting today.  Denies pain.  Pt has not taken anything for symptoms.  Hx of breast CA.  Last chemo was 1/3.

## 2016-03-27 DIAGNOSIS — R6510 Systemic inflammatory response syndrome (SIRS) of non-infectious origin without acute organ dysfunction: Secondary | ICD-10-CM | POA: Diagnosis not present

## 2016-03-27 DIAGNOSIS — D702 Other drug-induced agranulocytosis: Secondary | ICD-10-CM | POA: Diagnosis not present

## 2016-03-27 DIAGNOSIS — C50312 Malignant neoplasm of lower-inner quadrant of left female breast: Secondary | ICD-10-CM | POA: Diagnosis not present

## 2016-03-27 DIAGNOSIS — D709 Neutropenia, unspecified: Secondary | ICD-10-CM | POA: Diagnosis not present

## 2016-03-27 DIAGNOSIS — Z170 Estrogen receptor positive status [ER+]: Secondary | ICD-10-CM | POA: Diagnosis not present

## 2016-03-27 LAB — URINE CULTURE: Culture: NO GROWTH

## 2016-03-27 LAB — COMPREHENSIVE METABOLIC PANEL
ALBUMIN: 3 g/dL — AB (ref 3.5–5.0)
ALT: 31 U/L (ref 14–54)
AST: 18 U/L (ref 15–41)
Alkaline Phosphatase: 58 U/L (ref 38–126)
Anion gap: 5 (ref 5–15)
BILIRUBIN TOTAL: 1 mg/dL (ref 0.3–1.2)
BUN: 13 mg/dL (ref 6–20)
CALCIUM: 8.2 mg/dL — AB (ref 8.9–10.3)
CO2: 29 mmol/L (ref 22–32)
Chloride: 101 mmol/L (ref 101–111)
Creatinine, Ser: 0.62 mg/dL (ref 0.44–1.00)
Glucose, Bld: 124 mg/dL — ABNORMAL HIGH (ref 65–99)
POTASSIUM: 4.7 mmol/L (ref 3.5–5.1)
Sodium: 135 mmol/L (ref 135–145)
TOTAL PROTEIN: 5.2 g/dL — AB (ref 6.5–8.1)

## 2016-03-27 LAB — MAGNESIUM: MAGNESIUM: 1.9 mg/dL (ref 1.7–2.4)

## 2016-03-27 LAB — CBC
HEMATOCRIT: 27.7 % — AB (ref 36.0–46.0)
HEMOGLOBIN: 9.5 g/dL — AB (ref 12.0–15.0)
MCH: 31.6 pg (ref 26.0–34.0)
MCHC: 34.3 g/dL (ref 30.0–36.0)
MCV: 92 fL (ref 78.0–100.0)
Platelets: 66 10*3/uL — ABNORMAL LOW (ref 150–400)
RBC: 3.01 MIL/uL — AB (ref 3.87–5.11)
RDW: 15.5 % (ref 11.5–15.5)
WBC: 0.3 10*3/uL — AB (ref 4.0–10.5)

## 2016-03-27 LAB — TSH: TSH: 0.925 u[IU]/mL (ref 0.350–4.500)

## 2016-03-27 LAB — PHOSPHORUS: PHOSPHORUS: 3.4 mg/dL (ref 2.5–4.6)

## 2016-03-27 MED ORDER — SODIUM CHLORIDE 0.9 % IV SOLN: INTRAVENOUS | Status: DC

## 2016-03-27 MED ORDER — OSELTAMIVIR PHOSPHATE 75 MG PO CAPS
75.0000 mg | ORAL_CAPSULE | Freq: Two times a day (BID) | ORAL | Status: DC
  Administered 2016-03-27 – 2016-03-29 (×5): 75 mg via ORAL
  Filled 2016-03-27 (×5): qty 1

## 2016-03-27 NOTE — Progress Notes (Signed)
Neutropenic/protective precautions added. WBC 0.3. Patient received chemo on 03/21/16. Added chemotherapy precautions through 03/28/16.  Droplet precautions continued.  Family and patient educated and verbalized understanding.

## 2016-03-27 NOTE — Progress Notes (Signed)
[facility] PROGRESS NOTE    Progress Note  Jeanette Macdonald  [id number] DOB: [birth year] DOA: 03/26/2016 PCP: No PCP Per Patient     Brief Narrative:   Jeanette Macdonald is an 46 y.o. female past medical history of breast cancer presents with 3 day history of cold type symptoms with runny nose sore throat and a cough with a mild attempt 100.8 with associated lightheadedness. Her last chemotherapy was in January 3 during this time she was given the last 2.  Assessment/Plan:   SIRS due to influenza PCR: Here for this PCR is positive, will start Tamiflu. D'dc empiric IV antibiotics. Culture data is negative till date. Continue to monitor fever curve. Continue aggressive IV fluid hydration.  Leukopenia: She received her nulesta on 03/22/2016, at this point there is no indication for an additional growth stimulating injection. I'll check her CBC tomorrow if it starts to rise or stabilize she could be discharged  home with neutropenic precautions as an outpatient with a repeated CBC in 1 week and if she continues to improve.  The patient vitals are stable she feels better than yesterday.  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  DVT prophylaxis: lovenox Family Communication:none Disposition Plan/Barrier to D/C: home in am if leukopenia stabilizes Code Status:     Code Status Orders        Start     Ordered   03/26/16 2028  Full code  Continuous     03/26/16 2027    Code Status History    Date Active Date Inactive Code Status Order ID Comments User Context   This patient has a current code status but no historical code status.        IV Access:    Peripheral IV   Procedures and diagnostic studies:   Dg Chest 2 View  Result Date: 03/26/2016 CLINICAL DATA:  Productive cough, fever, history of breast cancer EXAM: CHEST  2 VIEW COMPARISON:  10/23/2013 FINDINGS: The heart  size and mediastinal contours are within normal limits. Both lungs are clear. The visualized skeletal structures are unremarkable. IMPRESSION: No active cardiopulmonary disease. Electronically Signed   By: Lahoma Crocker M.D.   On: 03/26/2016 16:10     Medical Consultants:    None.  Anti-Infectives:   None  Subjective:    Jeanette Macdonald she relates she feels much better than yesterday.  Objective:    Vitals:   03/26/16 1915 03/26/16 2000 03/26/16 2030 03/27/16 0646  BP: 100/69 119/83 98/62 (!) 94/55  Pulse: 88  98 99  Resp: 16 17 18 16   Temp:   99.3 F (37.4 C) 98.7 F (37.1 C)  TempSrc:   Oral Oral  SpO2: 97%  100% 100%  Weight:   63.1 kg (139 lb 1.8 oz)   Height:   5\' 4"  (1.626 m)     Intake/Output Summary (Last 24 hours) at 03/27/16 0900 Last data filed at 03/27/16 [id number]  Gross per 24 hour  Intake          3541.67 ml  Output                0 ml  Net          3541.67 ml   Filed Weights   03/26/16 1628 03/26/16 2030  Weight: 59.9 kg (132 lb) 63.1 kg (139 lb 1.8 oz)    Exam: General exam: In no acute distress. Respiratory system: Good  air movement and clear to auscultation. Cardiovascular system: S1 & S2 heard, RRR. No JVD. Gastrointestinal system: Abdomen is nondistended, soft and nontender.  Central nervous system: Alert and oriented. No focal neurological deficits. Extremities: No pedal edema. Skin: No rashes, lesions or ulcers Psychiatry: Judgement and insight appear normal. Mood & affect appropriate.    Data Reviewed:    Labs: Basic Metabolic Panel:  Recent Labs Lab 03/26/16 1556 03/27/16 0547  NA 134* 135  K 4.0 4.7  CL 100* 101  CO2 29 29  GLUCOSE 108* 124*  BUN 19 13  CREATININE 0.52 0.62  CALCIUM 8.3* 8.2*  MG  --  1.9  PHOS  --  3.4   GFR Estimated Creatinine Clearance: 75.1 mL/min (by C-G formula based on SCr of 0.62 mg/dL). Liver Function Tests:  Recent Labs Lab 03/26/16 1556 03/27/16 0547  AST 22 18  ALT 34 31  ALKPHOS 75  58  BILITOT 0.8 1.0  PROT 5.7* 5.2*  ALBUMIN 3.2* 3.0*   No results for input(s): LIPASE, AMYLASE in the last 168 hours. No results for input(s): AMMONIA in the last 168 hours. Coagulation profile  Recent Labs Lab 03/26/16 1556  INR 1.14    CBC:  Recent Labs Lab 03/26/16 1556 03/27/16 0547  WBC 1.4* 0.3*  NEUTROABS 1.2*  --   HGB 10.7* 9.5*  HCT 31.5* 27.7*  MCV 92.1 92.0  PLT 99* 66*   Cardiac Enzymes: No results for input(s): CKTOTAL, CKMB, CKMBINDEX, TROPONINI in the last 168 hours. BNP (last 3 results) No results for input(s): PROBNP in the last 8760 hours. CBG: No results for input(s): GLUCAP in the last 168 hours. D-Dimer: No results for input(s): DDIMER in the last 72 hours. Hgb A1c: No results for input(s): HGBA1C in the last 72 hours. Lipid Profile: No results for input(s): CHOL, HDL, LDLCALC, TRIG, CHOLHDL, LDLDIRECT in the last 72 hours. Thyroid function studies:  Recent Labs  03/27/16 0547  TSH 0.925   Anemia work up: No results for input(s): VITAMINB12, FOLATE, FERRITIN, TIBC, IRON, RETICCTPCT in the last 72 hours. Sepsis Labs:  Recent Labs Lab 03/26/16 1556 03/26/16 1607 03/27/16 0547  WBC 1.4*  --  0.3*  LATICACIDVEN  --  1.19  --    Microbiology Recent Results (from the past 240 hour(s))  Culture, blood (Routine x 2)     Status: None (Preliminary result)   Collection Time: 03/26/16  4:29 PM  Result Value Ref Range Status   Specimen Description   Final    BLOOD RIGHT ANTECUBITAL Performed at [place]  Final   Culture PENDING  Incomplete   Report Status PENDING  Incomplete     Medications:   . ceFEPime (MAXIPIME) IV  2 g Intravenous Q8H  . guaiFENesin  600 mg Oral BID  . oseltamivir  75 mg Oral BID  . pantoprazole  40 mg Oral [id number]  . sodium chloride flush  3 mL Intravenous Q12H  . vancomycin  750 mg Intravenous Q12H   Continuous Infusions: . sodium  chloride      Time spent: 25 min   LOS: 0 days   Charlynne Cousins  [facility] Pager [phone]  *Please refer to [email or web address], password [email or web address] to get updated schedule on who will round on this patient, as hospitalists switch teams weekly. If 7PM-7AM, please contact night-coverage at [email or web address], password [email or web address] for any overnight needs.  03/27/2016, 9:00 AM

## 2016-03-28 DIAGNOSIS — D702 Other drug-induced agranulocytosis: Secondary | ICD-10-CM | POA: Diagnosis not present

## 2016-03-28 DIAGNOSIS — D709 Neutropenia, unspecified: Secondary | ICD-10-CM | POA: Diagnosis not present

## 2016-03-28 DIAGNOSIS — R5081 Fever presenting with conditions classified elsewhere: Secondary | ICD-10-CM

## 2016-03-28 DIAGNOSIS — R6510 Systemic inflammatory response syndrome (SIRS) of non-infectious origin without acute organ dysfunction: Secondary | ICD-10-CM | POA: Diagnosis not present

## 2016-03-28 DIAGNOSIS — C50312 Malignant neoplasm of lower-inner quadrant of left female breast: Secondary | ICD-10-CM | POA: Diagnosis not present

## 2016-03-28 DIAGNOSIS — J111 Influenza due to unidentified influenza virus with other respiratory manifestations: Secondary | ICD-10-CM

## 2016-03-28 LAB — CBC WITH DIFFERENTIAL/PLATELET
BASOS PCT: 0 %
Basophils Absolute: 0 10*3/uL (ref 0.0–0.1)
Eosinophils Absolute: 0 10*3/uL (ref 0.0–0.7)
Eosinophils Relative: 4 %
HEMATOCRIT: 28.2 % — AB (ref 36.0–46.0)
Hemoglobin: 9.6 g/dL — ABNORMAL LOW (ref 12.0–15.0)
LYMPHS ABS: 0.3 10*3/uL — AB (ref 0.7–4.0)
Lymphocytes Relative: 63 %
MCH: 30.1 pg (ref 26.0–34.0)
MCHC: 34 g/dL (ref 30.0–36.0)
MCV: 88.4 fL (ref 78.0–100.0)
Monocytes Absolute: 0.1 10*3/uL (ref 0.1–1.0)
Monocytes Relative: 23 %
NEUTROS ABS: 0.1 10*3/uL — AB (ref 1.7–7.7)
Neutrophils Relative %: 10 %
Platelets: 49 10*3/uL — ABNORMAL LOW (ref 150–400)
RBC: 3.19 MIL/uL — ABNORMAL LOW (ref 3.87–5.11)
RDW: 14.7 % (ref 11.5–15.5)
WBC: 0.5 10*3/uL — CL (ref 4.0–10.5)

## 2016-03-28 MED ORDER — LEVOFLOXACIN 750 MG PO TABS: 750.0000 mg | ORAL_TABLET | Freq: Every day | ORAL | Status: DC

## 2016-03-28 MED ORDER — LEVOFLOXACIN 750 MG PO TABS
750.0000 mg | ORAL_TABLET | Freq: Every day | ORAL | Status: DC
  Administered 2016-03-28 – 2016-03-29 (×2): 750 mg via ORAL
  Filled 2016-03-28 (×2): qty 1

## 2016-03-28 NOTE — Progress Notes (Signed)
[facility] PROGRESS NOTE    Progress Note  Jeanette Macdonald  [id number] DOB: [birth year] DOA: 03/26/2016 PCP: No PCP Per Patient     Brief Narrative:   Jeanette Macdonald is an 46 y.o. female past medical history of breast cancer presents with 3 day history of cold type symptoms with runny nose sore throat and a cough with a mild attempt 100.8 with associated lightheadedness. Her last chemotherapy was in January 3 during this time she was given the last 2.  Assessment/Plan:   SIRS due to influenza PCR: Her influenza PCR is positive, contTamiflu.  She started having fevers overnight, her leukocytosis is improving, will go ahead and start her on Levaquin and monitor fever curve. Culture data is negative till date. Continue to monitor fever curve.  Neutropenia: She received her nulesta on 03/22/2016, at this point there is no indication for an additional growth stimulating injection. Her neutropenia is improving CBC in 1 week and if she continues to improve.  The patient vitals are stable she feels better than yesterday.  Malignant neoplasm of lower-inner quadrant of left female breast (HCC)/ Breast cancer [facility]) Follow-up with [facility] oncology as an outpatient.  DVT prophylaxis: lovenox Family Communication:none Disposition Plan/Barrier to D/C: home in am if neutropenia stabilizes Code Status:     Code Status Orders        Start     Ordered   03/26/16 2028  Full code  Continuous     03/26/16 2027    Code Status History    Date Active Date Inactive Code Status Order ID Comments User Context   This patient has a current code status but no historical code status.        IV Access:    Peripheral IV   Procedures and diagnostic studies:   Dg Chest 2 View  Result Date: 03/26/2016 CLINICAL DATA:  Productive cough, fever, history of breast cancer EXAM: CHEST  2 VIEW COMPARISON:  10/23/2013 FINDINGS: The heart size and mediastinal contours are within normal limits. Both lungs  are clear. The visualized skeletal structures are unremarkable. IMPRESSION: No active cardiopulmonary disease. Electronically Signed   By: Lahoma Crocker M.D.   On: 03/26/2016 16:10     Medical Consultants:    None.  Anti-Infectives:   None  Subjective:    Jeanette Macdonald she relates she has fever overnight but she is otherwise feels better.  Objective:    Vitals:   03/27/16 1330 03/27/16 2146 03/28/16 0654 03/28/16 0845  BP: (!) 106/54 91/61 (!) 89/64 (!) 103/57  Pulse: 100 (!) 104 90   Resp: 18 18 18    Temp: (!) 100.5 F (38.1 C) (!) 100.5 F (38.1 C) 99.9 F (37.7 C)   TempSrc: Oral Oral Oral   SpO2: 100% 100% 100%   Weight:      Height:        Intake/Output Summary (Last 24 hours) at 03/28/16 0924 Last data filed at 03/27/16 2157  Gross per 24 hour  Intake              600 ml  Output                0 ml  Net              600 ml   Filed Weights   03/26/16 1628 03/26/16 2030  Weight: 59.9 kg (132 lb) 63.1 kg (139 lb 1.8 oz)    Exam: General exam: In no acute distress. Respiratory system: Good air  movement and clear to auscultation. Cardiovascular system: S1 & S2 heard, RRR. No JVD. Gastrointestinal system: Abdomen is nondistended, soft and nontender.  Extremities: No pedal edema. Skin: No rashes, lesions or ulcers Psychiatry: Judgement and insight appear normal. Mood & affect appropriate.    Data Reviewed:    Labs: Basic Metabolic Panel:  Recent Labs Lab 03/26/16 1556 03/27/16 0547  NA 134* 135  K 4.0 4.7  CL 100* 101  CO2 29 29  GLUCOSE 108* 124*  BUN 19 13  CREATININE 0.52 0.62  CALCIUM 8.3* 8.2*  MG  --  1.9  PHOS  --  3.4   GFR Estimated Creatinine Clearance: 75.1 mL/min (by C-G formula based on SCr of 0.62 mg/dL). Liver Function Tests:  Recent Labs Lab 03/26/16 1556 03/27/16 0547  AST 22 18  ALT 34 31  ALKPHOS 75 58  BILITOT 0.8 1.0  PROT 5.7* 5.2*  ALBUMIN 3.2* 3.0*   No results for input(s): LIPASE, AMYLASE in the last 168  hours. No results for input(s): AMMONIA in the last 168 hours. Coagulation profile  Recent Labs Lab 03/26/16 1556  INR 1.14    CBC:  Recent Labs Lab 03/26/16 1556 03/27/16 0547 03/28/16 0612  WBC 1.4* 0.3* 0.5*  NEUTROABS 1.2*  --  0.1*  HGB 10.7* 9.5* 9.6*  HCT 31.5* 27.7* 28.2*  MCV 92.1 92.0 88.4  PLT 99* 66* 49*   Cardiac Enzymes: No results for input(s): CKTOTAL, CKMB, CKMBINDEX, TROPONINI in the last 168 hours. BNP (last 3 results) No results for input(s): PROBNP in the last 8760 hours. CBG: No results for input(s): GLUCAP in the last 168 hours. D-Dimer: No results for input(s): DDIMER in the last 72 hours. Hgb A1c: No results for input(s): HGBA1C in the last 72 hours. Lipid Profile: No results for input(s): CHOL, HDL, LDLCALC, TRIG, CHOLHDL, LDLDIRECT in the last 72 hours. Thyroid function studies:  Recent Labs  03/27/16 0547  TSH 0.925   Anemia work up: No results for input(s): VITAMINB12, FOLATE, FERRITIN, TIBC, IRON, RETICCTPCT in the last 72 hours. Sepsis Labs:  Recent Labs Lab 03/26/16 1556 03/26/16 1607 03/27/16 0547 03/28/16 0612  WBC 1.4*  --  0.3* 0.5*  LATICACIDVEN  --  1.19  --   --    Microbiology Recent Results (from the past 240 hour(s))  Urine culture     Status: None   Collection Time: 03/26/16  3:57 PM  Result Value Ref Range Status   Specimen Description URINE, RANDOM  Final   Special Requests NONE  Final   Culture NO GROWTH Performed at [facility]   Final   Report Status 03/27/2016 FINAL  Final  Culture, blood (Routine x 2)     Status: None (Preliminary result)   Collection Time: 03/26/16  4:29 PM  Result Value Ref Range Status   Specimen Description BLOOD RIGHT ANTECUBITAL  Final   Special Requests BOTTLES DRAWN AEROBIC AND ANAEROBIC 5CC  Final   Culture   Final    NO GROWTH < 24 HOURS Performed at [facility]    Report Status PENDING  Incomplete  Culture, blood (Routine x 2)     Status: None  (Preliminary result)   Collection Time: 03/26/16  4:29 PM  Result Value Ref Range Status   Specimen Description BLOOD RIGHT HAND  Final   Special Requests IN PEDIATRIC BOTTLE [place]  Final   Culture   Final    NO GROWTH < 24 HOURS Performed at [facility]  Report Status PENDING  Incomplete     Medications:   . guaiFENesin  600 mg Oral BID  . levofloxacin  750 mg Oral Daily  . oseltamivir  75 mg Oral BID  . pantoprazole  40 mg Oral [id number]  . sodium chloride flush  3 mL Intravenous Q12H   Continuous Infusions:   Time spent: 25 min   LOS: 0 days   Charlynne Cousins  [facility] Pager [phone]  *Please refer to [email or web address], password [email or web address] to get updated schedule on who will round on this patient, as hospitalists switch teams weekly. If 7PM-7AM, please contact night-coverage at [email or web address], password [email or web address] for any overnight needs.  03/28/2016, 9:24 AM

## 2016-03-29 DIAGNOSIS — D709 Neutropenia, unspecified: Secondary | ICD-10-CM

## 2016-03-29 DIAGNOSIS — R6510 Systemic inflammatory response syndrome (SIRS) of non-infectious origin without acute organ dysfunction: Secondary | ICD-10-CM

## 2016-03-29 DIAGNOSIS — C50312 Malignant neoplasm of lower-inner quadrant of left female breast: Secondary | ICD-10-CM

## 2016-03-29 DIAGNOSIS — D702 Other drug-induced agranulocytosis: Secondary | ICD-10-CM

## 2016-03-29 DIAGNOSIS — Z170 Estrogen receptor positive status [ER+]: Secondary | ICD-10-CM

## 2016-03-29 DIAGNOSIS — R5081 Fever presenting with conditions classified elsewhere: Secondary | ICD-10-CM

## 2016-03-29 LAB — CBC WITH DIFFERENTIAL/PLATELET
BASOS ABS: 0 10*3/uL (ref 0.0–0.1)
Basophils Relative: 1 %
EOS ABS: 0 10*3/uL (ref 0.0–0.7)
Eosinophils Relative: 2 %
HCT: 27.8 % — ABNORMAL LOW (ref 36.0–46.0)
Hemoglobin: 9.4 g/dL — ABNORMAL LOW (ref 12.0–15.0)
LYMPHS ABS: 0.4 10*3/uL — AB (ref 0.7–4.0)
Lymphocytes Relative: 20 %
MCH: 30 pg (ref 26.0–34.0)
MCHC: 33.8 g/dL (ref 30.0–36.0)
MCV: 88.8 fL (ref 78.0–100.0)
MONOS PCT: 20 %
Monocytes Absolute: 0.4 10*3/uL (ref 0.1–1.0)
Neutro Abs: 1 10*3/uL — ABNORMAL LOW (ref 1.7–7.7)
Neutrophils Relative %: 57 %
PLATELETS: 41 10*3/uL — AB (ref 150–400)
RBC: 3.13 MIL/uL — AB (ref 3.87–5.11)
RDW: 14.7 % (ref 11.5–15.5)
WBC: 1.8 10*3/uL — AB (ref 4.0–10.5)

## 2016-03-29 MED ORDER — LEVOFLOXACIN 750 MG PO TABS: 750.0000 mg | ORAL_TABLET | Freq: Every day | ORAL | 0 refills | Status: AC

## 2016-03-29 MED ORDER — OSELTAMIVIR PHOSPHATE 75 MG PO CAPS: 75.0000 mg | ORAL_CAPSULE | Freq: Two times a day (BID) | ORAL | 0 refills | Status: AC

## 2016-03-29 MED ORDER — GUAIFENESIN ER 600 MG PO TB12: 600.0000 mg | ORAL_TABLET | Freq: Two times a day (BID) | ORAL | Status: DC

## 2016-03-29 NOTE — Discharge Summary (Signed)
Physician Discharge Summary  Jeanette Macdonald [id number] DOB: 1970-04-26 DOA: 03/26/2016  PCP: No PCP Per Patient  Admit date: 03/26/2016 Discharge date: 03/29/2016  Admitted From: Home Disposition:  Home   Recommendations for Outpatient Follow-up:  1. Follow up with PCP in 1-2 weeks 2. Keep previously scheduled appointment with oncology 3. Take new prescriptions as prescribed 4. Please obtain BMP/CBCD in one week  Home Health:No Equipment/Devices: None  Discharge Condition: Stable CODE STATUS: Full code Diet recommendation: Regular diet  Brief/Interim Summary: Jeanette Macdonald is an 46 y.o. female past medical history of breast cancer presents with 3 day history of cold type symptoms with runny nose sore throat and a cough with a mild attempt 100.8 with associated lightheadedness. Her last chemotherapy was in January 3 during this time she was given the last 2.  She tested positive for influenza.  She was found to have a fever overnight from 1/8-1/9.  She was started on Levaquin.  Her WBC and neutrophil count increased and she had ANC of 1000 on day of discharge.  She was given a prescription to continue her antibiotics (Tamiflu and Levaquin) at discharge.  Discharge Diagnoses:  Active Problems:   Malignant neoplasm of lower-inner quadrant of left female breast (HCC)   Breast cancer (HCC)   SIRS (systemic inflammatory response syndrome) (HCC)   Neutropenia, drug-induced (HCC)   Neutropenic fever ([place])    Discharge Instructions  Discharge Instructions    Call MD for:  difficulty breathing, headache or visual disturbances    Complete by:  As directed    Call MD for:  extreme fatigue    Complete by:  As directed    Call MD for:  hives    Complete by:  As directed    Call MD for:  persistant dizziness or light-headedness    Complete by:  As directed    Call MD for:  persistant nausea and vomiting    Complete by:  As directed    Call MD for:  severe uncontrolled pain    Complete by:   As directed    Call MD for:  temperature >100.4    Complete by:  As directed    Diet - low sodium heart healthy    Complete by:  As directed    Increase activity slowly    Complete by:  As directed      Allergies as of 03/29/2016      Reactions   Docetaxel    Other reaction(s): Other (See Comments) SOB, chest tightness   Penicillins Hives      Medication List    STOP taking these medications   ondansetron 8 MG tablet Commonly known as:  ZOFRAN     TAKE these medications   ALPRAZolam 0.5 MG tablet Commonly known as:  XANAX Take 1 tablet (0.5 mg total) by mouth at bedtime as needed for sleep or anxiety.   dexamethasone 4 MG tablet Commonly known as:  DECADRON Take 2 tablets by mouth once a day on the day after chemotherapy and then take 2 tablets two times a day for 2 days. Take with food. What changed:  how much to take  how to take this  when to take this  additional instructions   FINACEA 15 % cream Generic drug:  Azelaic Acid every evening. After skin is thoroughly washed and patted dry, gently but thoroughly massage a thin film of azelaic acid cream into the affected area twice daily, in the morning and evening.   guaiFENesin  600 MG 12 hr tablet Commonly known as:  MUCINEX Take 1 tablet (600 mg total) by mouth 2 (two) times daily.   levofloxacin 750 MG tablet Commonly known as:  LEVAQUIN Take 1 tablet (750 mg total) by mouth daily. Start taking on:  03/30/2016   oseltamivir 75 MG capsule Commonly known as:  TAMIFLU Take 1 capsule (75 mg total) by mouth 2 (two) times daily.   pantoprazole 20 MG tablet Commonly known as:  PROTONIX Take 40 mg by mouth every morning.   prochlorperazine 10 MG tablet Commonly known as:  COMPAZINE Take 1 tablet (10 mg total) by mouth every 6 (six) hours as needed (Nausea or vomiting).      Follow-up Information    Truitt Merle, MD. Go on 04/09/2016.   Specialties:  Hematology, Oncology Contact information: [address] [place] [address] [phone]          Allergies  Allergen Reactions  . Docetaxel     Other reaction(s): Other (See Comments) SOB, chest tightness  . Penicillins Hives    Consultations:  None   Procedures/Studies: Dg Chest 2 View  Result Date: 03/26/2016 CLINICAL DATA:  Productive cough, fever, history of breast cancer EXAM: CHEST  2 VIEW COMPARISON:  10/23/2013 FINDINGS: The heart size and mediastinal contours are within normal limits. Both lungs are clear. The visualized skeletal structures are unremarkable. IMPRESSION: No active cardiopulmonary disease. Electronically Signed   By: Lahoma Crocker M.D.   On: 03/26/2016 16:10      Subjective: Patient feeling weak today but better than yesterday.  States she feels ready to discharge today.  Blood work from this morning shows [place] of 1,000 and improved WBC count.  Patient has follow up with her oncologist on 04/09/16.  Discharge Exam: Vitals:   03/28/16 2147 03/29/16 0626  BP: (!) 87/56 (!) 90/57  Pulse: 92 92  Resp: 19 18  Temp: 99 F (37.2 C) 98.7 F (37.1 C)   Vitals:   03/28/16 1430 03/28/16 1554 03/28/16 2147 03/29/16 0626  BP: (!) 81/54 (!) 86/54 (!) 87/56 (!) 90/57  Pulse: (!) 101  92 92  Resp: (!) 22  19 18   Temp: 99.6 F (37.6 C)  99 F (37.2 C) 98.7 F (37.1 C)  TempSrc: Oral  Oral Oral  SpO2: 100%  100% 99%  Weight:      Height:        General: Pt is alert, awake, not in acute distress Cardiovascular: RRR, S1/S2 +, no rubs, no gallops Respiratory: CTA bilaterally, no wheezing, no rhonchi Abdominal: Soft, NT, ND, bowel sounds + Extremities: no edema, no cyanosis    The results of significant diagnostics from this hospitalization (including imaging, microbiology, ancillary and laboratory) are listed below for reference.     Microbiology: Recent Results (from the past 240 hour(s))  Urine culture     Status: None   Collection Time: 03/26/16  3:57 PM  Result Value Ref Range Status    Specimen Description URINE, RANDOM  Final   Special Requests NONE  Final   Culture NO GROWTH Performed at [facility]   Final   Report Status 03/27/2016 FINAL  Final  Culture, blood (Routine x 2)     Status: None (Preliminary result)   Collection Time: 03/26/16  4:29 PM  Result Value Ref Range Status   Specimen Description BLOOD RIGHT ANTECUBITAL  Final   Special Requests BOTTLES DRAWN AEROBIC AND ANAEROBIC 5CC  Final   Culture   Final  NO GROWTH 2 DAYS Performed at [facility]    Report Status PENDING  Incomplete  Culture, blood (Routine x 2)     Status: None (Preliminary result)   Collection Time: 03/26/16  4:29 PM  Result Value Ref Range Status   Specimen Description BLOOD RIGHT HAND  Final   Special Requests IN PEDIATRIC BOTTLE [place]  Final   Culture   Final    NO GROWTH 2 DAYS Performed at [facility]    Report Status PENDING  Incomplete     Labs: BNP (last 3 results) No results for input(s): BNP in the last 8760 hours. Basic Metabolic Panel:  Recent Labs Lab 03/26/16 1556 03/27/16 0547  NA 134* 135  K 4.0 4.7  CL 100* 101  CO2 29 29  GLUCOSE 108* 124*  BUN 19 13  CREATININE 0.52 0.62  CALCIUM 8.3* 8.2*  MG  --  1.9  PHOS  --  3.4   Liver Function Tests:  Recent Labs Lab 03/26/16 1556 03/27/16 0547  AST 22 18  ALT 34 31  ALKPHOS 75 58  BILITOT 0.8 1.0  PROT 5.7* 5.2*  ALBUMIN 3.2* 3.0*   No results for input(s): LIPASE, AMYLASE in the last 168 hours. No results for input(s): AMMONIA in the last 168 hours. CBC:  Recent Labs Lab 03/26/16 1556 03/27/16 0547 03/28/16 0612 03/29/16 1103  WBC 1.4* 0.3* 0.5* 1.8*  NEUTROABS 1.2*  --  0.1* 1.0*  HGB 10.7* 9.5* 9.6* 9.4*  HCT 31.5* 27.7* 28.2* 27.8*  MCV 92.1 92.0 88.4 88.8  PLT 99* 66* 49* 41*   Cardiac Enzymes: No results for input(s): CKTOTAL, CKMB, CKMBINDEX, TROPONINI in the last 168 hours. BNP: Invalid input(s): POCBNP CBG: No results for input(s): GLUCAP in  the last 168 hours. D-Dimer No results for input(s): DDIMER in the last 72 hours. Hgb A1c No results for input(s): HGBA1C in the last 72 hours. Lipid Profile No results for input(s): CHOL, HDL, LDLCALC, TRIG, CHOLHDL, LDLDIRECT in the last 72 hours. Thyroid function studies  Recent Labs  03/27/16 0547  TSH 0.925   Anemia work up No results for input(s): VITAMINB12, FOLATE, FERRITIN, TIBC, IRON, RETICCTPCT in the last 72 hours. Urinalysis    Component Value Date/Time   COLORURINE YELLOW 03/26/2016 1557   APPEARANCEUR HAZY (A) 03/26/2016 1557   LABSPEC 1.016 03/26/2016 1557   PHURINE 7.0 03/26/2016 1557   GLUCOSEU NEGATIVE 03/26/2016 1557   HGBUR NEGATIVE 03/26/2016 1557   BILIRUBINUR NEGATIVE 03/26/2016 1557   KETONESUR NEGATIVE 03/26/2016 1557   PROTEINUR NEGATIVE 03/26/2016 1557   UROBILINOGEN 1.0 03/30/2010 2228   NITRITE NEGATIVE 03/26/2016 1557   LEUKOCYTESUR TRACE (A) 03/26/2016 1557   Sepsis Labs Invalid input(s): PROCALCITONIN,  WBC,  LACTICIDVEN Microbiology Recent Results (from the past 240 hour(s))  Urine culture     Status: None   Collection Time: 03/26/16  3:57 PM  Result Value Ref Range Status   Specimen Description URINE, RANDOM  Final   Special Requests NONE  Final   Culture NO GROWTH Performed at [facility]   Final   Report Status 03/27/2016 FINAL  Final  Culture, blood (Routine x 2)     Status: None (Preliminary result)   Collection Time: 03/26/16  4:29 PM  Result Value Ref Range Status   Specimen Description BLOOD RIGHT ANTECUBITAL  Final   Special Requests BOTTLES DRAWN AEROBIC AND ANAEROBIC 5CC  Final   Culture   Final    NO GROWTH 2  DAYS Performed at [facility]    Report Status PENDING  Incomplete  Culture, blood (Routine x 2)     Status: None (Preliminary result)   Collection Time: 03/26/16  4:29 PM  Result Value Ref Range Status   Specimen Description BLOOD RIGHT HAND  Final   Special Requests IN PEDIATRIC BOTTLE [place]   Final   Culture   Final    NO GROWTH 2 DAYS Performed at [facility]    Report Status PENDING  Incomplete     Time coordinating discharge: Over 30 minutes  SIGNED:   Loretha Stapler, MD  [facility] 03/29/2016, 2:41 PM Pager [phone] If 7PM-7AM, please contact night-coverage [email or web address] Password [email or web address]

## 2016-03-31 LAB — CULTURE, BLOOD (ROUTINE X 2)
CULTURE: NO GROWTH
Culture: NO GROWTH

## 2016-05-07 ENCOUNTER — Encounter (HOSPITAL_COMMUNITY): Payer: Self-pay

## 2016-05-07 ENCOUNTER — Inpatient Hospital Stay (HOSPITAL_COMMUNITY)
Admission: EM | Admit: 2016-05-07 | Discharge: 2016-05-09 | DRG: 810 | Disposition: A | Discharge status: Home / Self Care | Payer: 59 | Attending: Internal Medicine | Admitting: Internal Medicine

## 2016-05-07 ENCOUNTER — Emergency Department (HOSPITAL_COMMUNITY): Payer: 59

## 2016-05-07 DIAGNOSIS — Z79899 Other long term (current) drug therapy: Secondary | ICD-10-CM

## 2016-05-07 DIAGNOSIS — D61818 Other pancytopenia: Secondary | ICD-10-CM | POA: Diagnosis present

## 2016-05-07 DIAGNOSIS — R509 Fever, unspecified: Secondary | ICD-10-CM | POA: Diagnosis not present

## 2016-05-07 DIAGNOSIS — D709 Neutropenia, unspecified: Secondary | ICD-10-CM | POA: Diagnosis present

## 2016-05-07 DIAGNOSIS — R5081 Fever presenting with conditions classified elsewhere: Secondary | ICD-10-CM | POA: Diagnosis present

## 2016-05-07 DIAGNOSIS — Z880 Allergy status to penicillin: Secondary | ICD-10-CM

## 2016-05-07 DIAGNOSIS — D701 Agranulocytosis secondary to cancer chemotherapy: Secondary | ICD-10-CM | POA: Diagnosis not present

## 2016-05-07 DIAGNOSIS — C50312 Malignant neoplasm of lower-inner quadrant of left female breast: Secondary | ICD-10-CM | POA: Diagnosis present

## 2016-05-07 DIAGNOSIS — K219 Gastro-esophageal reflux disease without esophagitis: Secondary | ICD-10-CM | POA: Diagnosis present

## 2016-05-07 DIAGNOSIS — D61810 Antineoplastic chemotherapy induced pancytopenia: Secondary | ICD-10-CM | POA: Diagnosis present

## 2016-05-07 DIAGNOSIS — T451X5A Adverse effect of antineoplastic and immunosuppressive drugs, initial encounter: Secondary | ICD-10-CM | POA: Diagnosis present

## 2016-05-07 NOTE — ED Provider Notes (Signed)
[place] DEPT Provider Note   CSN: [id number] Arrival date & time: 05/07/16  2223     History   Chief Complaint Chief Complaint  Patient presents with  . Fever    HPI Jeanette Macdonald is a 46 y.o. female.   Fever   This is a new problem. The current episode started 3 to 5 hours ago. The problem occurs constantly. The problem has not changed since onset.The maximum temperature noted was 101 to 101.9 F. Pertinent negatives include no chest pain, no fussiness, no sleepiness, no diarrhea, no vomiting, no congestion, no headaches, no sore throat, no tugging at ear, no muscle aches and no cough. Associated symptoms comments: Rhinorrhea attributed to chemo. Right ear congestion since chemo 1 week ago.  No URI, N/V/D, dysuria, frequency, abd pain, or rashes. . She has tried nothing for the symptoms.    Past Medical History:  Diagnosis Date  . Abscess    perianal  . Anxiety    Flying  . History of hepatitis B    20+ years ago  . Malignant neoplasm of lower-inner quadrant of left female breast ([place]) 12/29/2015    Patient Active Problem List   Diagnosis Date Noted  . Neutropenia, drug-induced ([place]) 03/28/2016  . Neutropenic fever ([place]) 03/28/2016  . Breast cancer ([place]) 03/26/2016  . SIRS (systemic inflammatory response syndrome) ([place]) 03/26/2016  . Malignant neoplasm of lower-inner quadrant of left female breast ([place]) 12/29/2015  . Palpitations 01/06/2014    Past Surgical History:  Procedure Laterality Date  . CESAREAN SECTION  02/02/08    OB History    Gravida Para Term Preterm AB Living   3 1       1    SAB TAB Ectopic Multiple Live Births                   Home Medications    Prior to Admission medications   Medication Sig Start Date End Date Taking? Authorizing Provider  ALPRAZolam Duanne Moron) 0.5 MG tablet Take 1 tablet (0.5 mg total) by mouth at bedtime as needed for sleep or anxiety. 01/04/16  Yes Truitt Merle, MD  Azelaic Acid (FINACEA) 15 % cream Apply 1  application topically every evening. After skin is thoroughly washed and patted dry, gently but thoroughly massage a thin film of azelaic acid cream into the affected area twice daily, in the morning and evening.    Yes Historical Provider, MD  pantoprazole (PROTONIX) 20 MG tablet Take 40 mg by mouth every morning. 03/05/16  Yes Historical Provider, MD  prochlorperazine (COMPAZINE) 10 MG tablet Take 1 tablet (10 mg total) by mouth every 6 (six) hours as needed (Nausea or vomiting). 01/04/16  Yes Truitt Merle, MD  dexamethasone (DECADRON) 4 MG tablet Take 2 tablets by mouth once a day on the day after chemotherapy and then take 2 tablets two times a day for 2 days. Take with food. Patient not taking: Reported on 05/08/2016 01/04/16   Truitt Merle, MD  guaiFENesin (MUCINEX) 600 MG 12 hr tablet Take 1 tablet (600 mg total) by mouth 2 (two) times daily. Patient not taking: Reported on 05/08/2016 03/29/16   Eber Jones, MD    Family History Family History  Problem Relation Age of Onset  . Cancer Maternal Grandmother     stomach  . Cancer Paternal Grandmother     lung cancer   . Cancer Maternal Aunt     liver cancer  . Cancer Maternal Grandfather     lung cancer  .  Cancer Paternal Grandfather     lung cancer  . CAD Neg Hx     Social History Social History  Substance Use Topics  . Smoking status: Never Smoker  . Smokeless tobacco: Never Used  . Alcohol use No     Comment: occasionally      Allergies   Docetaxel and Penicillins   Review of Systems Review of Systems  Constitutional: Positive for fever.  HENT: Negative for congestion and sore throat.   Respiratory: Negative for cough.   Cardiovascular: Negative for chest pain.  Gastrointestinal: Negative for diarrhea and vomiting.  Neurological: Negative for headaches.   Ten systems are reviewed and are negative for acute change except as noted in the HPI   Physical Exam Updated Vital Signs BP 101/62 (BP Location: Left Arm)    Pulse 111   Temp 100 F (37.8 C) (Oral)   Resp 19   Ht 5\' 4"  (1.626 m)   Wt 135 lb (61.2 kg)   SpO2 97%   BMI 23.17 kg/m   Physical Exam  Constitutional: She is oriented to person, place, and time. She appears well-developed and well-nourished. No distress.  HENT:  Head: Normocephalic and atraumatic.  Right Ear: Tympanic membrane is not injected and not erythematous. A middle ear effusion is present.  Left Ear: Tympanic membrane is not injected and not erythematous.  No middle ear effusion.  Nose: Nose normal.  Mouth/Throat: No posterior oropharyngeal edema or posterior oropharyngeal erythema.  Alopecia. Post nasal drip  Eyes: Conjunctivae and EOM are normal. Pupils are equal, round, and reactive to light. Right eye exhibits no discharge. Left eye exhibits no discharge. No scleral icterus.  Neck: Normal range of motion. Neck supple.  Cardiovascular: Normal rate and regular rhythm.  Exam reveals no gallop and no friction rub.   No murmur heard. Pulmonary/Chest: Effort normal and breath sounds normal. No stridor. No respiratory distress. She has no rales.  Abdominal: Soft. She exhibits no distension. There is no tenderness.  Musculoskeletal: She exhibits no edema or tenderness.  Neurological: She is alert and oriented to person, place, and time.  Skin: Skin is warm and dry. No rash noted. She is not diaphoretic. No erythema.  Psychiatric: She has a normal mood and affect.  Vitals reviewed.    ED Treatments / Results  Labs (all labs ordered are listed, but only abnormal results are displayed) Labs Reviewed  URINALYSIS, ROUTINE W REFLEX MICROSCOPIC - Abnormal; Notable for the following:       Result Value   APPearance CLOUDY (*)    Leukocytes, UA LARGE (*)    Bacteria, UA MANY (*)    Squamous Epithelial / LPF 6-30 (*)    All other components within normal limits  URINE CULTURE  CBC WITH DIFFERENTIAL/PLATELET  COMPREHENSIVE METABOLIC PANEL  INFLUENZA PANEL BY PCR (TYPE A &  B)    EKG  EKG Interpretation None       Radiology No results found.  Procedures Procedures (including critical care time)  Medications Ordered in ED Medications - No data to display   Initial Impression / Assessment and Plan / ED Course  I have reviewed the triage vital signs and the nursing notes.  Pertinent labs & imaging results that were available during my care of the patient were reviewed by me and considered in my medical decision making (see chart for details).     Last ANC on 1000 on 1/11. Will obtain screening labs to assess for neutropenia and etiology of fever.  UA with +leuks but contaminated with sQ cells. Other labs pending.  Patient care turned over to Dr Betsey Holiday at 0100. Patient case and results discussed in detail; please see their note for further ED managment.     Final Clinical Impressions(s) / ED Diagnoses   Final diagnoses:  Fever, unspecified fever cause      Fatima Blank, MD 05/08/16 [phone]

## 2016-05-07 NOTE — ED Triage Notes (Signed)
Pt stated that she took her temp at home around 2000 and it was 101. Pt temp 100 in triage. Denies any other symptoms. Last chemo was 2/12. Hx breast cancer.

## 2016-05-08 ENCOUNTER — Encounter (HOSPITAL_COMMUNITY): Payer: Self-pay | Admitting: Internal Medicine

## 2016-05-08 ENCOUNTER — Emergency Department (HOSPITAL_COMMUNITY): Payer: 59

## 2016-05-08 DIAGNOSIS — D61818 Other pancytopenia: Secondary | ICD-10-CM | POA: Diagnosis present

## 2016-05-08 DIAGNOSIS — R509 Fever, unspecified: Secondary | ICD-10-CM | POA: Diagnosis present

## 2016-05-08 DIAGNOSIS — Z170 Estrogen receptor positive status [ER+]: Secondary | ICD-10-CM | POA: Diagnosis not present

## 2016-05-08 DIAGNOSIS — C50312 Malignant neoplasm of lower-inner quadrant of left female breast: Secondary | ICD-10-CM | POA: Diagnosis present

## 2016-05-08 DIAGNOSIS — D701 Agranulocytosis secondary to cancer chemotherapy: Secondary | ICD-10-CM | POA: Diagnosis present

## 2016-05-08 DIAGNOSIS — R5081 Fever presenting with conditions classified elsewhere: Secondary | ICD-10-CM | POA: Diagnosis present

## 2016-05-08 DIAGNOSIS — D61810 Antineoplastic chemotherapy induced pancytopenia: Secondary | ICD-10-CM | POA: Diagnosis present

## 2016-05-08 DIAGNOSIS — Z880 Allergy status to penicillin: Secondary | ICD-10-CM | POA: Diagnosis not present

## 2016-05-08 DIAGNOSIS — Z79899 Other long term (current) drug therapy: Secondary | ICD-10-CM | POA: Diagnosis not present

## 2016-05-08 DIAGNOSIS — D709 Neutropenia, unspecified: Secondary | ICD-10-CM | POA: Diagnosis not present

## 2016-05-08 DIAGNOSIS — T451X5A Adverse effect of antineoplastic and immunosuppressive drugs, initial encounter: Secondary | ICD-10-CM | POA: Diagnosis present

## 2016-05-08 DIAGNOSIS — K219 Gastro-esophageal reflux disease without esophagitis: Secondary | ICD-10-CM | POA: Diagnosis present

## 2016-05-08 LAB — CBC WITH DIFFERENTIAL/PLATELET
BASOS PCT: 1 %
Basophils Absolute: 0 10*3/uL (ref 0.0–0.1)
EOS ABS: 0 10*3/uL (ref 0.0–0.7)
EOS PCT: 1 %
HCT: 32.3 % — ABNORMAL LOW (ref 36.0–46.0)
Hemoglobin: 11.1 g/dL — ABNORMAL LOW (ref 12.0–15.0)
Lymphocytes Relative: 58 %
Lymphs Abs: 0.6 10*3/uL — ABNORMAL LOW (ref 0.7–4.0)
MCH: 32.5 pg (ref 26.0–34.0)
MCHC: 34.4 g/dL (ref 30.0–36.0)
MCV: 94.4 fL (ref 78.0–100.0)
MONO ABS: 0.2 10*3/uL (ref 0.1–1.0)
MONOS PCT: 17 %
NEUTROS PCT: 23 %
Neutro Abs: 0.2 10*3/uL — ABNORMAL LOW (ref 1.7–7.7)
PLATELETS: 69 10*3/uL — AB (ref 150–400)
RBC: 3.42 MIL/uL — ABNORMAL LOW (ref 3.87–5.11)
RDW: 13.9 % (ref 11.5–15.5)
WBC: 1 10*3/uL — CL (ref 4.0–10.5)

## 2016-05-08 LAB — URINALYSIS, ROUTINE W REFLEX MICROSCOPIC
BILIRUBIN URINE: NEGATIVE
Glucose, UA: NEGATIVE mg/dL
HGB URINE DIPSTICK: NEGATIVE
KETONES UR: NEGATIVE mg/dL
NITRITE: NEGATIVE
Protein, ur: NEGATIVE mg/dL
Specific Gravity, Urine: 1.009 (ref 1.005–1.030)
pH: 7 (ref 5.0–8.0)

## 2016-05-08 LAB — I-STAT CG4 LACTIC ACID, ED: Lactic Acid, Venous: 1.49 mmol/L (ref 0.5–1.9)

## 2016-05-08 LAB — COMPREHENSIVE METABOLIC PANEL
ALBUMIN: 3.9 g/dL (ref 3.5–5.0)
ALT: 28 U/L (ref 14–54)
ANION GAP: 6 (ref 5–15)
AST: 19 U/L (ref 15–41)
Alkaline Phosphatase: 91 U/L (ref 38–126)
BUN: 12 mg/dL (ref 6–20)
CHLORIDE: 102 mmol/L (ref 101–111)
CO2: 28 mmol/L (ref 22–32)
Calcium: 9 mg/dL (ref 8.9–10.3)
Creatinine, Ser: 0.73 mg/dL (ref 0.44–1.00)
GFR calc non Af Amer: >60 mL/min (ref >60)
GLUCOSE: 108 mg/dL — AB (ref 65–99)
POTASSIUM: 4 mmol/L (ref 3.5–5.1)
SODIUM: 136 mmol/L (ref 135–145)
Total Bilirubin: 0.3 mg/dL (ref 0.3–1.2)
Total Protein: 6.4 g/dL — ABNORMAL LOW (ref 6.5–8.1)

## 2016-05-08 LAB — INFLUENZA PANEL BY PCR (TYPE A & B)
INFLAPCR: NEGATIVE
INFLBPCR: NEGATIVE

## 2016-05-08 MED ORDER — DEXTROSE 5 % IV SOLN
2.0000 g | Freq: Three times a day (TID) | INTRAVENOUS | Status: DC
  Filled 2016-05-08: qty 2

## 2016-05-08 MED ORDER — PROCHLORPERAZINE MALEATE 10 MG PO TABS: 10.0000 mg | ORAL_TABLET | Freq: Four times a day (QID) | ORAL | Status: DC | PRN

## 2016-05-08 MED ORDER — AZELAIC ACID 15 % EX GEL: 1.0000 "application " | Freq: Every evening | CUTANEOUS | Status: DC

## 2016-05-08 MED ORDER — VANCOMYCIN HCL IN DEXTROSE 750-5 MG/150ML-% IV SOLN
750.0000 mg | Freq: Two times a day (BID) | INTRAVENOUS | Status: DC
  Administered 2016-05-08: 750 mg via INTRAVENOUS
  Filled 2016-05-08: qty 150

## 2016-05-08 MED ORDER — ACETAMINOPHEN 650 MG RE SUPP: 650.0000 mg | Freq: Four times a day (QID) | RECTAL | Status: DC | PRN

## 2016-05-08 MED ORDER — ALPRAZOLAM 0.5 MG PO TABS: 0.5000 mg | ORAL_TABLET | Freq: Every evening | ORAL | Status: DC | PRN

## 2016-05-08 MED ORDER — ACETAMINOPHEN 325 MG PO TABS: 650.0000 mg | ORAL_TABLET | Freq: Four times a day (QID) | ORAL | Status: DC | PRN

## 2016-05-08 MED ORDER — DEXTROSE 5 % IV SOLN
2.0000 g | Freq: Once | INTRAVENOUS | Status: AC
  Administered 2016-05-08: 2 g via INTRAVENOUS
  Filled 2016-05-08: qty 2

## 2016-05-08 MED ORDER — PANTOPRAZOLE SODIUM 40 MG PO TBEC
40.0000 mg | DELAYED_RELEASE_TABLET | Freq: Every day | ORAL | Status: DC
  Administered 2016-05-08 – 2016-05-09 (×2): 40 mg via ORAL
  Filled 2016-05-08 (×2): qty 1

## 2016-05-08 MED ORDER — LEVOFLOXACIN 750 MG PO TABS
750.0000 mg | ORAL_TABLET | Freq: Every day | ORAL | Status: DC
  Administered 2016-05-08 – 2016-05-09 (×2): 750 mg via ORAL
  Filled 2016-05-08 (×2): qty 1

## 2016-05-08 MED ORDER — ONDANSETRON HCL 4 MG PO TABS: 4.0000 mg | ORAL_TABLET | Freq: Four times a day (QID) | ORAL | Status: DC | PRN

## 2016-05-08 MED ORDER — ONDANSETRON HCL 4 MG/2ML IJ SOLN: 4.0000 mg | Freq: Four times a day (QID) | INTRAMUSCULAR | Status: DC | PRN

## 2016-05-08 NOTE — ED Notes (Signed)
Patient is refusing her chest x-ray. States she had one 3 weeks ago and don't want another one. Informed her due to her developing a fever that something different may be going on that was not manifesting 3 weeks ago. Patient continues to refuse. Radiology tech informed her if she changed her mind to let staff know.

## 2016-05-08 NOTE — Progress Notes (Signed)
PROGRESS NOTE    Jeanette Macdonald  [id number] DOB: 12-Aug-1970 DOA: 05/07/2016 PCP: No PCP Per Patient     Brief Narrative:  46 yo female with history of breast cancer on chemotherapy at [facility], last infusion on 04/30/2016. Presents with fever and chills. Temperature at home 101.9. On initial examination, patient hemodynamically stable, with no source of infection, positive neutropenia. Placed on broad spectrum antibiotics and admitted for neutropenic fever. Patient refusing IV, change antibiotic therapy to po.    Assessment & Plan:   Principal Problem:   Neutropenic fever ([place]) Active Problems:   Malignant neoplasm of lower-inner quadrant of left female breast (HCC)   Pancytopenia (HCC)   Febrile neutropenia (HCC)   1. Neutropenic fever. Patient very concerned about venous damage, willing to avoid IV acces. Clinically hemodynamic stable, has been afebrile, will continue antibiotic therapy with po levofloxacin with low threshold to up-escalate to IV antibiotics if fever or hemodynamic compromise.  Chest film personally reviewed with no infiltrates, influenza negative, no urine infection or skin rash. Cultures have been no growth.   2. Breast Cancer. Recent chemotherapy, patient received colony stimulating factor after chemotherapy. Noted wbc at 1 with 23 % PMN, will continue neutropenic precautions.   3. GERD. Tolerating po well, continue antiacid therapy.     DVT prophylaxis: scd  Code Status: full  Family Communication: No family at the bedside  Disposition Plan: home   Consultants:     Procedures:    Antimicrobials:   Levofloxacin     Subjective: Patient with no chest pain, no nausea or vomiting, no diarrhea or rash. Patient is concern of further vein injury from IV's, concern for lymphedema. Requesting to hold on IV antibiotics  Objective: Vitals:   05/08/16 0447 05/08/16 0509 05/08/16 1300 05/08/16 1352  BP: (!) 133/110 98/63 (!) 105/57   Pulse: 90  98 97  Resp:  16  16 16   Temp: 99 F (37.2 C)  98.2 F (36.8 C) 98.2 F (36.8 C)  TempSrc: Oral  Oral Oral  SpO2: 97%  100% 100%  Weight: 61.2 kg (135 lb)     Height: 5\' 4"  (1.626 m)       Intake/Output Summary (Last 24 hours) at 05/08/16 1504 Last data filed at 05/08/16 0900  Gross per 24 hour  Intake              600 ml  Output                0 ml  Net              600 ml   Filed Weights   05/07/16 2251 05/08/16 0447  Weight: 61.2 kg (135 lb) 61.2 kg (135 lb)    Examination:  General exam: not in pain or dyspnea E ENT: mild pallor, no icterus.  Respiratory system: Clear to auscultation. Respiratory effort normal. No wheezing, rales or rhonchi.  Cardiovascular system: S1 & S2 heard, RRR. No JVD, murmurs, rubs, gallops or clicks. No pedal edema. Gastrointestinal system: Abdomen is nondistended, soft and nontender. No organomegaly or masses felt. Normal bowel sounds heard. Central nervous system: Alert and oriented. No focal neurological deficits. Extremities: Symmetric 5 x 5 power. Skin: No rashes, lesions or ulcers   Data Reviewed: I have personally reviewed following labs and imaging studies  CBC:  Recent Labs Lab 05/08/16 0023  WBC 1.0*  NEUTROABS 0.2*  HGB 11.1*  HCT 32.3*  MCV 94.4  PLT 69*   Basic Metabolic Panel:  Recent Labs Lab 05/08/16 0023  NA 136  K 4.0  CL 102  CO2 28  GLUCOSE 108*  BUN 12  CREATININE 0.73  CALCIUM 9.0   GFR: Estimated Creatinine Clearance: 75.1 mL/min (by C-G formula based on SCr of 0.73 mg/dL). Liver Function Tests:  Recent Labs Lab 05/08/16 0023  AST 19  ALT 28  ALKPHOS 91  BILITOT 0.3  PROT 6.4*  ALBUMIN 3.9   No results for input(s): LIPASE, AMYLASE in the last 168 hours. No results for input(s): AMMONIA in the last 168 hours. Coagulation Profile: No results for input(s): INR, PROTIME in the last 168 hours. Cardiac Enzymes: No results for input(s): CKTOTAL, CKMB, CKMBINDEX, TROPONINI in the last 168 hours. BNP  (last 3 results) No results for input(s): PROBNP in the last 8760 hours. HbA1C: No results for input(s): HGBA1C in the last 72 hours. CBG: No results for input(s): GLUCAP in the last 168 hours. Lipid Profile: No results for input(s): CHOL, HDL, LDLCALC, TRIG, CHOLHDL, LDLDIRECT in the last 72 hours. Thyroid Function Tests: No results for input(s): TSH, T4TOTAL, FREET4, T3FREE, THYROIDAB in the last 72 hours. Anemia Panel: No results for input(s): VITAMINB12, FOLATE, FERRITIN, TIBC, IRON, RETICCTPCT in the last 72 hours. Sepsis Labs:  Recent Labs Lab 05/08/16 0221  LATICACIDVEN 1.49    No results found for this or any previous visit (from the past 240 hour(s)).       Radiology Studies: Dg Chest 2 View  Result Date: 05/08/2016 CLINICAL DATA:  46 year old female with fever, congestion, and body aches. History of breast cancer with chemotherapy. EXAM: CHEST  2 VIEW COMPARISON:  Chest radiograph dated 03/26/2016 FINDINGS: The lungs are clear. There is no pleural effusion or pneumothorax. The cardiac silhouette is within normal limits. No acute osseous pathology. IMPRESSION: No active cardiopulmonary disease. Electronically Signed   By: Anner Crete M.D.   On: 05/08/2016 01:45        Scheduled Meds: . levofloxacin  750 mg Oral Daily  . pantoprazole  40 mg Oral Daily   Continuous Infusions:   LOS: 0 days        Jeanette Luckenbaugh Gerome Apley, MD [facility] Pager [phone]  If 7PM-7AM, please contact night-coverage [email or web address] Password [email or web address] 05/08/2016, 3:04 PM

## 2016-05-08 NOTE — Progress Notes (Signed)
Pharmacy Antibiotic Note  Jeanette Macdonald is a 46 y.o. female with hx of breast Ca  admitted on 05/07/2016 with febrile neutropenia.  Pharmacy has been consulted for cefepime and vancomycin dosing.  Plan: Vancomycin 750 IV every 12 hours.  Goal trough 15-20 mcg/mL.  Cefepime 2 Gm IV q8h   Height: 5\' 4"  (162.6 cm) Weight: 135 lb (61.2 kg) IBW/kg (Calculated) : 54.7  Temp (24hrs), Avg:99.5 F (37.5 C), Min:98.8 F (37.1 C), Max:100 F (37.8 C)   Recent Labs Lab 05/08/16 0023 05/08/16 0221  WBC 1.0*  --   CREATININE 0.73  --   LATICACIDVEN  --  1.49    Estimated Creatinine Clearance: 75.1 mL/min (by C-G formula based on SCr of 0.73 mg/dL).    Allergies  Allergen Reactions  . Docetaxel Shortness Of Breath    chest tightness  . Penicillins Hives    Has patient had a PCN reaction causing immediate rash, facial/tongue/throat swelling, SOB or lightheadedness with hypotension: yes Has patient had a PCN reaction causing severe rash involving mucus membranes or skin necrosis: no Has patient had a PCN reaction that required hospitalization: no Has patient had a PCN reaction occurring within the last 10 years: no If all of the above answers are "NO", then may proceed with Cephalosporin use.     Antimicrobials this admission: 2/20 cefepime >>  2/20 vancomycin >>   Dose adjustments this admission:   Microbiology results:  BCx:   UCx:    Sputum:    MRSA PCR:   Thank you for allowing pharmacy to be a part of this patient's care.  Dorrene German 05/08/2016 5:34 AM

## 2016-05-08 NOTE — Progress Notes (Signed)
Pt iv infiltrated at this time. Pt side is red and pt complains of itching and burning at and around the site. Offered pt to call  Provider for anti-itch medication and pt refuse.   Attempted to look for new site but pt states that "I believe I do not have anymore veins and I need a minute after this."  Pt requested that to take po antibiotic and informed pt that may not be an option.

## 2016-05-08 NOTE — Progress Notes (Signed)
Md notified patient's refusal of IV reinsertion. I told Dr. Cathlean Sauer that patient requesting to speak with him about her IV antibiotics. She is due for Cefipime at 1000,no IV access at this time.

## 2016-05-08 NOTE — H&P (Addendum)
History and Physical    Jeanette Macdonald [id number] DOB: 05/12/1970 DOA: 05/07/2016  PCP: No PCP Per Patient  Patient coming from: Home.  Chief Complaint: Fever and chills.  HPI: Jeanette Macdonald is a 46 y.o. female with breast cancer being treated at [facility], last chemotherapy on 04/30/2016 last week presents to the ER because of fever and chills. Patient started experiencing fever and chills last evening around 8 PM. Denies any associated productive cough chest pain nausea vomiting diarrhea. Patient states she recorded temperatures around 101.76F. In the ER patient is found to be febrile with neutropenia. Blood cultures were sent along with urine cultures. Chest x-ray was unremarkable. UA shows bacteria. Influenza PCR is negative. While waiting in the ER patient had mild frontal headache denies any neck pain. Last week patient was noticed to have superficial thrombophlebitis of the left upper extremity.  ED Course: Patient was started on cefepime.  Review of Systems: As per HPI, rest all negative.   Past Medical History:  Diagnosis Date  . Abscess    perianal  . Anxiety    Flying  . History of hepatitis B    20+ years ago  . Malignant neoplasm of lower-inner quadrant of left female breast ([place]) 12/29/2015    Past Surgical History:  Procedure Laterality Date  . CESAREAN SECTION  02/02/08     reports that she has never smoked. She has never used smokeless tobacco. She reports that she does not drink alcohol or use drugs.  Allergies  Allergen Reactions  . Docetaxel Shortness Of Breath    chest tightness  . Penicillins Hives    Has patient had a PCN reaction causing immediate rash, facial/tongue/throat swelling, SOB or lightheadedness with hypotension: yes Has patient had a PCN reaction causing severe rash involving mucus membranes or skin necrosis: no Has patient had a PCN reaction that required hospitalization: no Has patient had a PCN reaction occurring  within the last 10 years: no If all of the above answers are "NO", then may proceed with Cephalosporin use.     Family History  Problem Relation Age of Onset  . Cancer Maternal Grandmother     stomach  . Cancer Paternal Grandmother     lung cancer   . Cancer Maternal Aunt     liver cancer  . Cancer Maternal Grandfather     lung cancer  . Cancer Paternal Grandfather     lung cancer  . CAD Neg Hx     Prior to Admission medications   Medication Sig Start Date End Date Taking? Authorizing Provider  ALPRAZolam Duanne Moron) 0.5 MG tablet Take 1 tablet (0.5 mg total) by mouth at bedtime as needed for sleep or anxiety. 01/04/16  Yes Truitt Merle, MD  Azelaic Acid (FINACEA) 15 % cream Apply 1 application topically every evening. After skin is thoroughly washed and patted dry, gently but thoroughly massage a thin film of azelaic acid cream into the affected area twice daily, in the morning and evening.    Yes Historical Provider, MD  pantoprazole (PROTONIX) 20 MG tablet Take 40 mg by mouth every morning. 03/05/16  Yes Historical Provider, MD  prochlorperazine (COMPAZINE) 10 MG tablet Take 1 tablet (10 mg total) by mouth every 6 (six) hours as needed (Nausea or vomiting). 01/04/16  Yes Truitt Merle, MD  dexamethasone (DECADRON) 4 MG tablet Take 2 tablets by mouth once a day on the day after chemotherapy and then take 2 tablets two times a day for 2  days. Take with food. Patient not taking: Reported on 05/08/2016 01/04/16   Truitt Merle, MD  guaiFENesin (MUCINEX) 600 MG 12 hr tablet Take 1 tablet (600 mg total) by mouth 2 (two) times daily. Patient not taking: Reported on 05/08/2016 03/29/16   Eber Jones, MD    Physical Exam: Vitals:   05/08/16 0236 05/08/16 0403 05/08/16 0447 05/08/16 0509  BP: 100/56 112/98 (!) 133/110 98/63  Pulse: 88 93 90   Resp: 18 14 16    Temp:  98.8 F (37.1 C) 99 F (37.2 C)   TempSrc:  Oral Oral   SpO2: 94% 96% 97%   Weight:   61.2 kg (135 lb)   Height:   5\' 4"  (1.626  m)       Constitutional: Moderately built and nourished. Vitals:   05/08/16 0236 05/08/16 0403 05/08/16 0447 05/08/16 0509  BP: 100/56 112/98 (!) 133/110 98/63  Pulse: 88 93 90   Resp: 18 14 16    Temp:  98.8 F (37.1 C) 99 F (37.2 C)   TempSrc:  Oral Oral   SpO2: 94% 96% 97%   Weight:   61.2 kg (135 lb)   Height:   5\' 4"  (1.626 m)    Eyes: Anicteric no pallor. ENMT: No discharge from the ears eyes nose and mouth. Neck: No mass felt. No neck rigidity. Respiratory: No rhonchi or crepitations. Cardiovascular: S1 and S2 heard no murmurs appreciated. Abdomen: Soft nontender bowel sounds present. Musculoskeletal: No edema. No joint effusion. Skin: Erythematous skin around the superficial phlebitis area. Neurologic: Alert awake oriented to time place and person. Moves all extremities. Psychiatric: Appears normal. Normal affect.   Labs on Admission: I have personally reviewed following labs and imaging studies  CBC:  Recent Labs Lab 05/08/16 0023  WBC 1.0*  NEUTROABS 0.2*  HGB 11.1*  HCT 32.3*  MCV 94.4  PLT 69*   Basic Metabolic Panel:  Recent Labs Lab 05/08/16 0023  NA 136  K 4.0  CL 102  CO2 28  GLUCOSE 108*  BUN 12  CREATININE 0.73  CALCIUM 9.0   GFR: Estimated Creatinine Clearance: 75.1 mL/min (by C-G formula based on SCr of 0.73 mg/dL). Liver Function Tests:  Recent Labs Lab 05/08/16 0023  AST 19  ALT 28  ALKPHOS 91  BILITOT 0.3  PROT 6.4*  ALBUMIN 3.9   No results for input(s): LIPASE, AMYLASE in the last 168 hours. No results for input(s): AMMONIA in the last 168 hours. Coagulation Profile: No results for input(s): INR, PROTIME in the last 168 hours. Cardiac Enzymes: No results for input(s): CKTOTAL, CKMB, CKMBINDEX, TROPONINI in the last 168 hours. BNP (last 3 results) No results for input(s): PROBNP in the last 8760 hours. HbA1C: No results for input(s): HGBA1C in the last 72 hours. CBG: No results for input(s): GLUCAP in the last  168 hours. Lipid Profile: No results for input(s): CHOL, HDL, LDLCALC, TRIG, CHOLHDL, LDLDIRECT in the last 72 hours. Thyroid Function Tests: No results for input(s): TSH, T4TOTAL, FREET4, T3FREE, THYROIDAB in the last 72 hours. Anemia Panel: No results for input(s): VITAMINB12, FOLATE, FERRITIN, TIBC, IRON, RETICCTPCT in the last 72 hours. Urine analysis:    Component Value Date/Time   COLORURINE YELLOW 05/08/2016 0015   APPEARANCEUR CLOUDY (A) 05/08/2016 0015   LABSPEC 1.009 05/08/2016 0015   PHURINE 7.0 05/08/2016 0015   GLUCOSEU NEGATIVE 05/08/2016 0015   HGBUR NEGATIVE 05/08/2016 0015   BILIRUBINUR NEGATIVE 05/08/2016 0015   KETONESUR NEGATIVE 05/08/2016 0015   PROTEINUR  NEGATIVE 05/08/2016 0015   UROBILINOGEN 1.0 03/30/2010 2228   NITRITE NEGATIVE 05/08/2016 0015   LEUKOCYTESUR LARGE (A) 05/08/2016 0015   Sepsis Labs: @LABRCNTIP (procalcitonin:4,lacticidven:4) )No results found for this or any previous visit (from the past 240 hour(s)).   Radiological Exams on Admission: Dg Chest 2 View  Result Date: 05/08/2016 CLINICAL DATA:  46 year old female with fever, congestion, and body aches. History of breast cancer with chemotherapy. EXAM: CHEST  2 VIEW COMPARISON:  Chest radiograph dated 03/26/2016 FINDINGS: The lungs are clear. There is no pleural effusion or pneumothorax. The cardiac silhouette is within normal limits. No acute osseous pathology. IMPRESSION: No active cardiopulmonary disease. Electronically Signed   By: Anner Crete M.D.   On: 05/08/2016 01:45     Assessment/Plan Principal Problem:   Neutropenic fever ([place]) Active Problems:   Malignant neoplasm of lower-inner quadrant of left female breast (HCC)   Pancytopenia (HCC)   Febrile neutropenia ([place])    1. Febrile neutropenia - patient has been placed on vancomycin and cefepime. Follow urine cultures blood cultures. Patient did have mild headache but no neck rigidity. Closely observe. Follow CBC with  differential. 2. Pancytopenia probably from chemotherapy - follow CBC. 3. History of GERD on PPI. 4. Breast cancer being treated at [facility]. Last chemotherapy was last week.   DVT prophylaxis: SCDs. Code Status: Full code.  Family Communication: Discussed with patient.  Disposition Plan: Home.  Consults called: None.  Admission status: Inpatient.    Rise Patience MD [facility] Pager [phone].  If 7PM-7AM, please contact night-coverage [email or web address] Password [facility]  05/08/2016, 5:24 AM

## 2016-05-09 DIAGNOSIS — D61818 Other pancytopenia: Secondary | ICD-10-CM

## 2016-05-09 DIAGNOSIS — C50312 Malignant neoplasm of lower-inner quadrant of left female breast: Secondary | ICD-10-CM

## 2016-05-09 DIAGNOSIS — R5081 Fever presenting with conditions classified elsewhere: Secondary | ICD-10-CM

## 2016-05-09 DIAGNOSIS — D709 Neutropenia, unspecified: Secondary | ICD-10-CM

## 2016-05-09 DIAGNOSIS — Z170 Estrogen receptor positive status [ER+]: Secondary | ICD-10-CM

## 2016-05-09 LAB — CBC
HCT: 29.2 % — ABNORMAL LOW (ref 36.0–46.0)
Hemoglobin: 10 g/dL — ABNORMAL LOW (ref 12.0–15.0)
MCH: 31.7 pg (ref 26.0–34.0)
MCHC: 34.2 g/dL (ref 30.0–36.0)
MCV: 92.7 fL (ref 78.0–100.0)
PLATELETS: 73 10*3/uL — AB (ref 150–400)
RBC: 3.15 MIL/uL — ABNORMAL LOW (ref 3.87–5.11)
RDW: 13.9 % (ref 11.5–15.5)
WBC: 4.1 10*3/uL (ref 4.0–10.5)

## 2016-05-09 LAB — BASIC METABOLIC PANEL
Anion gap: 6 (ref 5–15)
BUN: 9 mg/dL (ref 6–20)
CALCIUM: 9.4 mg/dL (ref 8.9–10.3)
CHLORIDE: 104 mmol/L (ref 101–111)
CO2: 29 mmol/L (ref 22–32)
CREATININE: 0.66 mg/dL (ref 0.44–1.00)
GFR calc Af Amer: >60 mL/min (ref >60)
GFR calc non Af Amer: >60 mL/min (ref >60)
GLUCOSE: 89 mg/dL (ref 65–99)
Potassium: 4.1 mmol/L (ref 3.5–5.1)
Sodium: 139 mmol/L (ref 135–145)

## 2016-05-09 LAB — URINE CULTURE

## 2016-05-09 MED ORDER — LEVOFLOXACIN 750 MG PO TABS: 750.0000 mg | ORAL_TABLET | Freq: Every day | ORAL | 0 refills | Status: DC

## 2016-05-09 NOTE — Discharge Summary (Signed)
Physician Discharge Summary  Jeanette Macdonald [id number] DOB: Mar 05, 1971 DOA: 05/07/2016  PCP: No PCP Per Patient  Admit date: 05/07/2016 Discharge date: 05/09/2016  Admitted From: home  Disposition:  home   Recommendations for Outpatient Follow-up:  1.   follow platelets which dropped between Oct and Jan of this year (likely chemo related)  Home Health:  none Equipment/Devices:  none    Discharge Condition:  stable   CODE STATUS:  Full code   Diet recommendation:  Regular diet Consultations:  none    Discharge Diagnoses:  Principal Problem:   Neutropenic fever ([place]) Active Problems:   Malignant neoplasm of lower-inner quadrant of left female breast ([place])   Pancytopenia ([place])   Febrile neutropenia ([place])    Subjective: Feels well, no respiratory complaints, GI complaints or GU complaints. No rash.   Brief Summary: 46 yo female with history of breast cancer on chemotherapy at [facility], last infusion on 04/30/2016. Presents with fever and chills. Temperature at home 101.9. On initial examination, patient hemodynamically stable, with no source of infection, positive neutropenia. Placed on broad spectrum antibiotics and admitted for neutropenic fever. Patient refusing IV, change antibiotic therapy to po.   Hospital Course:   Neutropenic fever with Breast cancer - temp 101.9  - just received her last dose of chemotherapy at [facility] - received colony stimulating factor after chemotherapy.  - presented with neutropenic fever and respiratory symptoms on 1/8, was + for Influenza and was discharged with Tamiflu and Levaquin -  on this admission, Chest film with out infiltrates, influenza negative, no respiratory symptoms, Blood Cultures>  no growth.  - UA was + but patient states urine touched her thumb and it was contaminated- cultures show multiple bacterial species- no symptoms of UTI - WBC count 1.0 with 0.2% neutrophils on admission on 1/19, WBC now up to 4.1, Temp 99.7 yesterday  evening - given Vanc, Cefepime and Levaquin on admission-  Yesterday  she lost her IV and did not want another one placed therefore, only Levaquin given orally- she already has plans to leave the hospital today-  will complete a 7 day course of Levaquin   GERD - continue Protonix   Anemia/Thrombocytopenia - follow Hb and platelets which dropped between Oct and Jan of this year (likely chemo related)   Discharge Instructions  Discharge Instructions    Call MD for:  temperature >100.4    Complete by:  As directed    Diet general    Complete by:  As directed    Regular diet   Increase activity slowly    Complete by:  As directed      Allergies as of 05/09/2016      Reactions   Docetaxel Shortness Of Breath   chest tightness   Penicillins Hives   Has patient had a PCN reaction causing immediate rash, facial/tongue/throat swelling, SOB or lightheadedness with hypotension: yes Has patient had a PCN reaction causing severe rash involving mucus membranes or skin necrosis: no Has patient had a PCN reaction that required hospitalization: no Has patient had a PCN reaction occurring within the last 10 years: no If all of the above answers are "NO", then may proceed with Cephalosporin use.      Medication List    STOP taking these medications   dexamethasone 4 MG tablet Commonly known as:  DECADRON   guaiFENesin 600 MG 12 hr tablet Commonly known as:  MUCINEX     TAKE these medications   ALPRAZolam 0.5 MG tablet Commonly known  as:  XANAX Take 1 tablet (0.5 mg total) by mouth at bedtime as needed for sleep or anxiety.   FINACEA 15 % cream Generic drug:  Azelaic Acid Apply 1 application topically every evening. After skin is thoroughly washed and patted dry, gently but thoroughly massage a thin film of azelaic acid cream into the affected area twice daily, in the morning and evening.   levofloxacin 750 MG tablet Commonly known as:  LEVAQUIN Take 1 tablet (750 mg total) by  mouth daily. Start taking on:  05/10/2016   pantoprazole 20 MG tablet Commonly known as:  PROTONIX Take 40 mg by mouth every morning.   prochlorperazine 10 MG tablet Commonly known as:  COMPAZINE Take 1 tablet (10 mg total) by mouth every 6 (six) hours as needed (Nausea or vomiting).       Allergies  Allergen Reactions  . Docetaxel Shortness Of Breath    chest tightness  . Penicillins Hives    Has patient had a PCN reaction causing immediate rash, facial/tongue/throat swelling, SOB or lightheadedness with hypotension: yes Has patient had a PCN reaction causing severe rash involving mucus membranes or skin necrosis: no Has patient had a PCN reaction that required hospitalization: no Has patient had a PCN reaction occurring within the last 10 years: no If all of the above answers are "NO", then may proceed with Cephalosporin use.      Procedures/Studies:    Dg Chest 2 View  Result Date: 05/08/2016 CLINICAL DATA:  46 year old female with fever, congestion, and body aches. History of breast cancer with chemotherapy. EXAM: CHEST  2 VIEW COMPARISON:  Chest radiograph dated 03/26/2016 FINDINGS: The lungs are clear. There is no pleural effusion or pneumothorax. The cardiac silhouette is within normal limits. No acute osseous pathology. IMPRESSION: No active cardiopulmonary disease. Electronically Signed   By: Anner Crete M.D.   On: 05/08/2016 01:45        Discharge Exam: Vitals:   05/08/16 2039 05/09/16 0457  BP: 104/72 (!) 86/56  Pulse: 97 91  Resp: 16 13  Temp: 99.7 F (37.6 C) 98.7 F (37.1 C)   Vitals:   05/08/16 1300 05/08/16 1352 05/08/16 2039 05/09/16 0457  BP: (!) 105/57  104/72 (!) 86/56  Pulse: 98 97 97 91  Resp: 16 16 16 13   Temp: 98.2 F (36.8 C) 98.2 F (36.8 C) 99.7 F (37.6 C) 98.7 F (37.1 C)  TempSrc: Oral Oral Oral Oral  SpO2: 100% 100% 100% 100%  Weight:      Height:        General: Pt is alert, awake, not in acute  distress Cardiovascular: RRR, S1/S2 +, no rubs, no gallops Respiratory: CTA bilaterally, no wheezing, no rhonchi Abdominal: Soft, NT, ND, bowel sounds + Extremities: no edema, no cyanosis    The results of significant diagnostics from this hospitalization (including imaging, microbiology, ancillary and laboratory) are listed below for reference.     Microbiology: Recent Results (from the past 240 hour(s))  Urine culture     Status: Abnormal   Collection Time: 05/08/16 12:15 AM  Result Value Ref Range Status   Specimen Description URINE, CLEAN CATCH  Final   Special Requests NONE  Final   Culture MULTIPLE SPECIES PRESENT, SUGGEST RECOLLECTION (A)  Final   Report Status 05/09/2016 FINAL  Final  Culture, blood (Routine X 2) w Reflex to ID Panel     Status: None (Preliminary result)   Collection Time: 05/08/16  2:04 AM  Result Value Ref Range  Status   Specimen Description BLOOD RIGHT HAND  Final   Special Requests BOTTLES DRAWN AEROBIC AND ANAEROBIC 5ML  Final   Culture   Final    NO GROWTH 1 DAY Performed at [facility], [address], [place], [place] [address]    Report Status PENDING  Incomplete  Culture, blood (Routine X 2) w Reflex to ID Panel     Status: None (Preliminary result)   Collection Time: 05/08/16  2:04 AM  Result Value Ref Range Status   Specimen Description BLOOD RIGHT ARM  Final   Special Requests BOTTLES DRAWN AEROBIC AND ANAEROBIC 5ML  Final   Culture   Final    NO GROWTH 1 DAY Performed at [facility], [place] [address], [place], [place] [address]    Report Status PENDING  Incomplete     Labs: BNP (last 3 results) No results for input(s): BNP in the last 8760 hours. Basic Metabolic Panel:  Recent Labs Lab 05/08/16 0023 05/09/16 0443  NA 136 139  K 4.0 4.1  CL 102 104  CO2 28 29  GLUCOSE 108* 89  BUN 12 9  CREATININE 0.73 0.66  CALCIUM 9.0 9.4   Liver Function Tests:  Recent Labs Lab 05/08/16 0023  AST 19  ALT 28   ALKPHOS 91  BILITOT 0.3  PROT 6.4*  ALBUMIN 3.9   No results for input(s): LIPASE, AMYLASE in the last 168 hours. No results for input(s): AMMONIA in the last 168 hours. CBC:  Recent Labs Lab 05/08/16 0023 05/09/16 0443  WBC 1.0* 4.1  NEUTROABS 0.2*  --   HGB 11.1* 10.0*  HCT 32.3* 29.2*  MCV 94.4 92.7  PLT 69* 73*   Cardiac Enzymes: No results for input(s): CKTOTAL, CKMB, CKMBINDEX, TROPONINI in the last 168 hours. BNP: Invalid input(s): POCBNP CBG: No results for input(s): GLUCAP in the last 168 hours. D-Dimer No results for input(s): DDIMER in the last 72 hours. Hgb A1c No results for input(s): HGBA1C in the last 72 hours. Lipid Profile No results for input(s): CHOL, HDL, LDLCALC, TRIG, CHOLHDL, LDLDIRECT in the last 72 hours. Thyroid function studies No results for input(s): TSH, T4TOTAL, T3FREE, THYROIDAB in the last 72 hours.  Invalid input(s): FREET3 Anemia work up No results for input(s): VITAMINB12, FOLATE, FERRITIN, TIBC, IRON, RETICCTPCT in the last 72 hours. Urinalysis    Component Value Date/Time   COLORURINE YELLOW 05/08/2016 0015   APPEARANCEUR CLOUDY (A) 05/08/2016 0015   LABSPEC 1.009 05/08/2016 0015   PHURINE 7.0 05/08/2016 0015   GLUCOSEU NEGATIVE 05/08/2016 0015   HGBUR NEGATIVE 05/08/2016 0015   BILIRUBINUR NEGATIVE 05/08/2016 0015   KETONESUR NEGATIVE 05/08/2016 0015   PROTEINUR NEGATIVE 05/08/2016 0015   UROBILINOGEN 1.0 03/30/2010 2228   NITRITE NEGATIVE 05/08/2016 0015   LEUKOCYTESUR LARGE (A) 05/08/2016 0015   Sepsis Labs Invalid input(s): PROCALCITONIN,  WBC,  LACTICIDVEN Microbiology Recent Results (from the past 240 hour(s))  Urine culture     Status: Abnormal   Collection Time: 05/08/16 12:15 AM  Result Value Ref Range Status   Specimen Description URINE, CLEAN CATCH  Final   Special Requests NONE  Final   Culture MULTIPLE SPECIES PRESENT, SUGGEST RECOLLECTION (A)  Final   Report Status 05/09/2016 FINAL  Final  Culture,  blood (Routine X 2) w Reflex to ID Panel     Status: None (Preliminary result)   Collection Time: 05/08/16  2:04 AM  Result Value Ref Range Status   Specimen Description BLOOD  RIGHT HAND  Final   Special Requests BOTTLES DRAWN AEROBIC AND ANAEROBIC 5ML  Final   Culture   Final    NO GROWTH 1 DAY Performed at [facility], [place] [address], [place], [place] [address]    Report Status PENDING  Incomplete  Culture, blood (Routine X 2) w Reflex to ID Panel     Status: None (Preliminary result)   Collection Time: 05/08/16  2:04 AM  Result Value Ref Range Status   Specimen Description BLOOD RIGHT ARM  Final   Special Requests BOTTLES DRAWN AEROBIC AND ANAEROBIC 5ML  Final   Culture   Final    NO GROWTH 1 DAY Performed at [facility], [place] [address] [place], [place] [address]    Report Status PENDING  Incomplete     Time coordinating discharge: Over 30 minutes  SIGNED:   Debbe Odea, MD  [facility] 05/09/2016, 11:32 AM Pager   If 7PM-7AM, please contact night-coverage [email or web address] Password [email or web address]

## 2016-05-09 NOTE — Progress Notes (Signed)
Patient d/c home. Stable. 

## 2016-05-09 NOTE — Progress Notes (Signed)
Patient in good spirit. Denies pain. D/c instructions given, understood meds,follow-up. Jeanette Macdonald

## 2016-05-10 LAB — HIV ANTIBODY (ROUTINE TESTING W REFLEX): HIV Screen 4th Generation wRfx: NONREACTIVE

## 2016-05-13 LAB — CULTURE, BLOOD (ROUTINE X 2)
CULTURE: NO GROWTH
Culture: NO GROWTH

## 2016-09-16 ENCOUNTER — Encounter (HOSPITAL_COMMUNITY): Payer: Self-pay | Admitting: *Deleted

## 2016-09-16 ENCOUNTER — Emergency Department (HOSPITAL_BASED_OUTPATIENT_CLINIC_OR_DEPARTMENT_OTHER)
Admit: 2016-09-16 | Discharge: 2016-09-16 | Disposition: A | Discharge status: Home / Self Care | Payer: 59 | Attending: Emergency Medicine | Admitting: Emergency Medicine

## 2016-09-16 ENCOUNTER — Emergency Department (HOSPITAL_COMMUNITY)
Admission: EM | Admit: 2016-09-16 | Discharge: 2016-09-16 | Disposition: A | Discharge status: Home / Self Care | Payer: 59 | Attending: Emergency Medicine | Admitting: Emergency Medicine

## 2016-09-16 DIAGNOSIS — M79604 Pain in right leg: Secondary | ICD-10-CM

## 2016-09-16 DIAGNOSIS — M79661 Pain in right lower leg: Secondary | ICD-10-CM | POA: Insufficient documentation

## 2016-09-16 DIAGNOSIS — Z853 Personal history of malignant neoplasm of breast: Secondary | ICD-10-CM | POA: Insufficient documentation

## 2016-09-16 DIAGNOSIS — M79609 Pain in unspecified limb: Secondary | ICD-10-CM

## 2016-09-16 DIAGNOSIS — Z79899 Other long term (current) drug therapy: Secondary | ICD-10-CM | POA: Diagnosis not present

## 2016-09-16 NOTE — Progress Notes (Signed)
VASCULAR LAB PRELIMINARY  PRELIMINARY  PRELIMINARY  PRELIMINARY  Right lower extremity venous duplex completed.    Preliminary report:  There is no DVT or SVT noted in the right lower extremity.   Called report to Dr. Ellwood Sayers, [facility], RVT 09/16/2016, 2:44 PM

## 2016-09-16 NOTE — ED Triage Notes (Signed)
Pt reports starting new chemo med recently, pt having dull right lower leg pain x 1 week and wants to r/o blood clot.

## 2016-09-16 NOTE — Discharge Instructions (Signed)
As discussed, your evaluation today has been largely reassuring.  But, it is important that you monitor your condition carefully, and do not hesitate to return to the ED if you develop new, or concerning changes in your condition. ? ?Otherwise, please follow-up with your physician for appropriate ongoing care. ? ?

## 2016-09-16 NOTE — ED Provider Notes (Signed)
[facility] DEPT Provider Note   CSN: [id number] Arrival date & time: 09/16/16  1319  By signing my name below, I, Reola Mosher, attest that this documentation has been prepared under the direction and in the presence of Carmin Muskrat, MD. Electronically Signed: Reola Mosher, ED Scribe. 09/16/16. 2:09 PM.  History   Chief Complaint Chief Complaint  Patient presents with  . Leg Pain   The history is provided by the patient and medical records. No language interpreter was used.    HPI Comments: Jeanette Macdonald is a 46 y.o. 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 Per pt, she began Arimidex therapy two weeks ago and since one week ago she has been experiencing an intermittent burning and shooting pain to her right shin. Today, she states that she noticed a small depressed area in the skin to her area of pain and her pain to the area had acutely worsened. She is currently concerned for a possible DVT and came into the ED for r/o of this. No alleviating or exacerbating factors noted. No h/o bleeding/clotting disorders. She denies fever, chills, nausea, vomiting, chest pain, shortness of breath, numbness, weakness, leg swelling, color change, or any other associated symptoms.   Past Medical History:  Diagnosis Date  . Abscess    perianal  . Anxiety    Flying  . History of hepatitis B    20+ years ago  . Malignant neoplasm of lower-inner quadrant of left female breast ([place]) 12/29/2015   Patient Active Problem List   Diagnosis Date Noted  . Pancytopenia ([place]) 05/08/2016  . Febrile neutropenia ([place]) 05/08/2016  . Neutropenia, drug-induced ([place]) 03/28/2016  . Neutropenic fever ([place]) 03/28/2016  . Breast  cancer ([place]) 03/26/2016  . SIRS (systemic inflammatory response syndrome) ([place]) 03/26/2016  . Malignant neoplasm of lower-inner quadrant of left female breast ([place]) 12/29/2015  . Palpitations 01/06/2014   Past Surgical History:  Procedure Laterality Date  . CESAREAN SECTION  02/02/08   OB History    Gravida Para Term Preterm AB Living   '3 1       1   ' SAB TAB Ectopic Multiple Live Births                 Home Medications    Prior to Admission medications   Medication Sig Start Date End Date Taking? Authorizing Provider  ALPRAZolam Duanne Moron) 0.5 MG tablet Take 1 tablet (0.5 mg total) by mouth at bedtime as needed for sleep or anxiety. 01/04/16   Truitt Merle, MD  Azelaic Acid (FINACEA) 15 % cream Apply 1 application topically every evening. After skin is thoroughly washed and patted dry, gently but thoroughly massage a thin film of azelaic acid cream into the affected area twice daily, in the morning and evening.     [provider]  levofloxacin (LEVAQUIN) 750 MG tablet Take 1 tablet (750 mg total) by mouth daily. 05/10/16   Debbe Odea, MD  pantoprazole (PROTONIX) 20 MG tablet Take 40 mg by mouth every morning. 03/05/16   [provider]  prochlorperazine (COMPAZINE) 10 MG tablet Take 1 tablet (10 mg total) by mouth every 6 (six) hours as needed (Nausea or vomiting). 01/04/16   Truitt Merle, MD   Family History  Family History  Problem Relation Age of Onset  . Cancer Maternal Grandmother        stomach  . Cancer Paternal Grandmother        lung cancer   . Cancer Maternal Aunt        liver cancer  . Cancer Maternal Grandfather        lung cancer  . Cancer Paternal Grandfather        lung cancer  . CAD Neg Hx    Social History Social History  Substance Use Topics  . Smoking status: Never Smoker  . Smokeless tobacco: Never Used  . Alcohol use No     Comment: occasionally    Allergies   Docetaxel; Penicillins; and Vancomycin  Review of Systems Review of  Systems  Constitutional: Negative for chills and fever.  Respiratory: Negative for shortness of breath.   Cardiovascular: Negative for chest pain and leg swelling.  Gastrointestinal: Negative for nausea and vomiting.  Musculoskeletal:       +right lower leg pain  Skin: Negative for color change.  Neurological: Negative for weakness and numbness.  Hematological: Does not bruise/bleed easily.  All other systems reviewed and are negative.  Physical Exam Updated Vital Signs BP 99/78 (BP Location: Left Arm)   Pulse 93   Temp 98.3 F (36.8 C) (Oral)   Resp 18   SpO2 100%   Physical Exam  Constitutional: She appears well-developed and well-nourished. No distress.  HENT:  Head: Normocephalic and atraumatic.  Eyes: Conjunctivae are normal.  Neck: Normal range of motion.  Cardiovascular: Normal rate, regular rhythm and normal heart sounds.   No murmur heard. Pulmonary/Chest: Effort normal and breath sounds normal. No respiratory distress. She has no wheezes. She has no rales.  Abdominal: She exhibits no distension.  Musculoskeletal: Normal range of motion. She exhibits no edema or tenderness.  No calf tenderness. No discoloration or edema. Normal ROM of the foot and ankle w/o pain.   Neurological: She is alert.  Skin: No pallor.  Psychiatric: She has a normal mood and affect. Her behavior is normal.  Nursing note and vitals reviewed.  ED Treatments / Results  DIAGNOSTIC STUDIES: Oxygen Saturation is 100% on RA, normal by my interpretation.   COORDINATION OF CARE: 2:08 PM-Discussed next steps with pt. Pt verbalized understanding and is agreeable with the plan.   Radiology [place] negative for DVT Procedures Procedures   Medications Ordered in ED Medications - No data to display  Initial Impression / Assessment and Plan / ED Course  I have reviewed the triage vital signs and the nursing notes.  Pertinent labs & imaging results that were available during my care of the patient  were reviewed by me and considered in my medical decision making (see chart for details).  Patient with recent history of breast cancer presents with concern of right calf discomfort, suspicion for DVT. Patient does have elevated risk profile, but ultrasound is unremarkable. No respiratory palpitations, no hypoxia, tachypnea, tachycardia, no evidence PE. Physical exam findings consistent with infection. Patient discharged in stable condition with outpatient follow-up.  Final Clinical Impressions(s) / ED Diagnoses  Right leg pain  I personally performed the services described in this documentation, which was scribed in my presence. The recorded information has been reviewed and is accurate.       Carmin Muskrat, MD 09/16/16 1500

## 2016-09-16 NOTE — ED Notes (Signed)
Declined W/C at D/C and was escorted to lobby by RN. 

## 2017-09-25 IMAGING — CR DG CHEST 2V
2 series · 2 of 2 positions shown · non-contrast
Comparison: 10/23/2013

CLINICAL DATA: Productive cough, fever, history of breast cancer

EXAM:
CHEST  2 VIEW

[w chest lat]
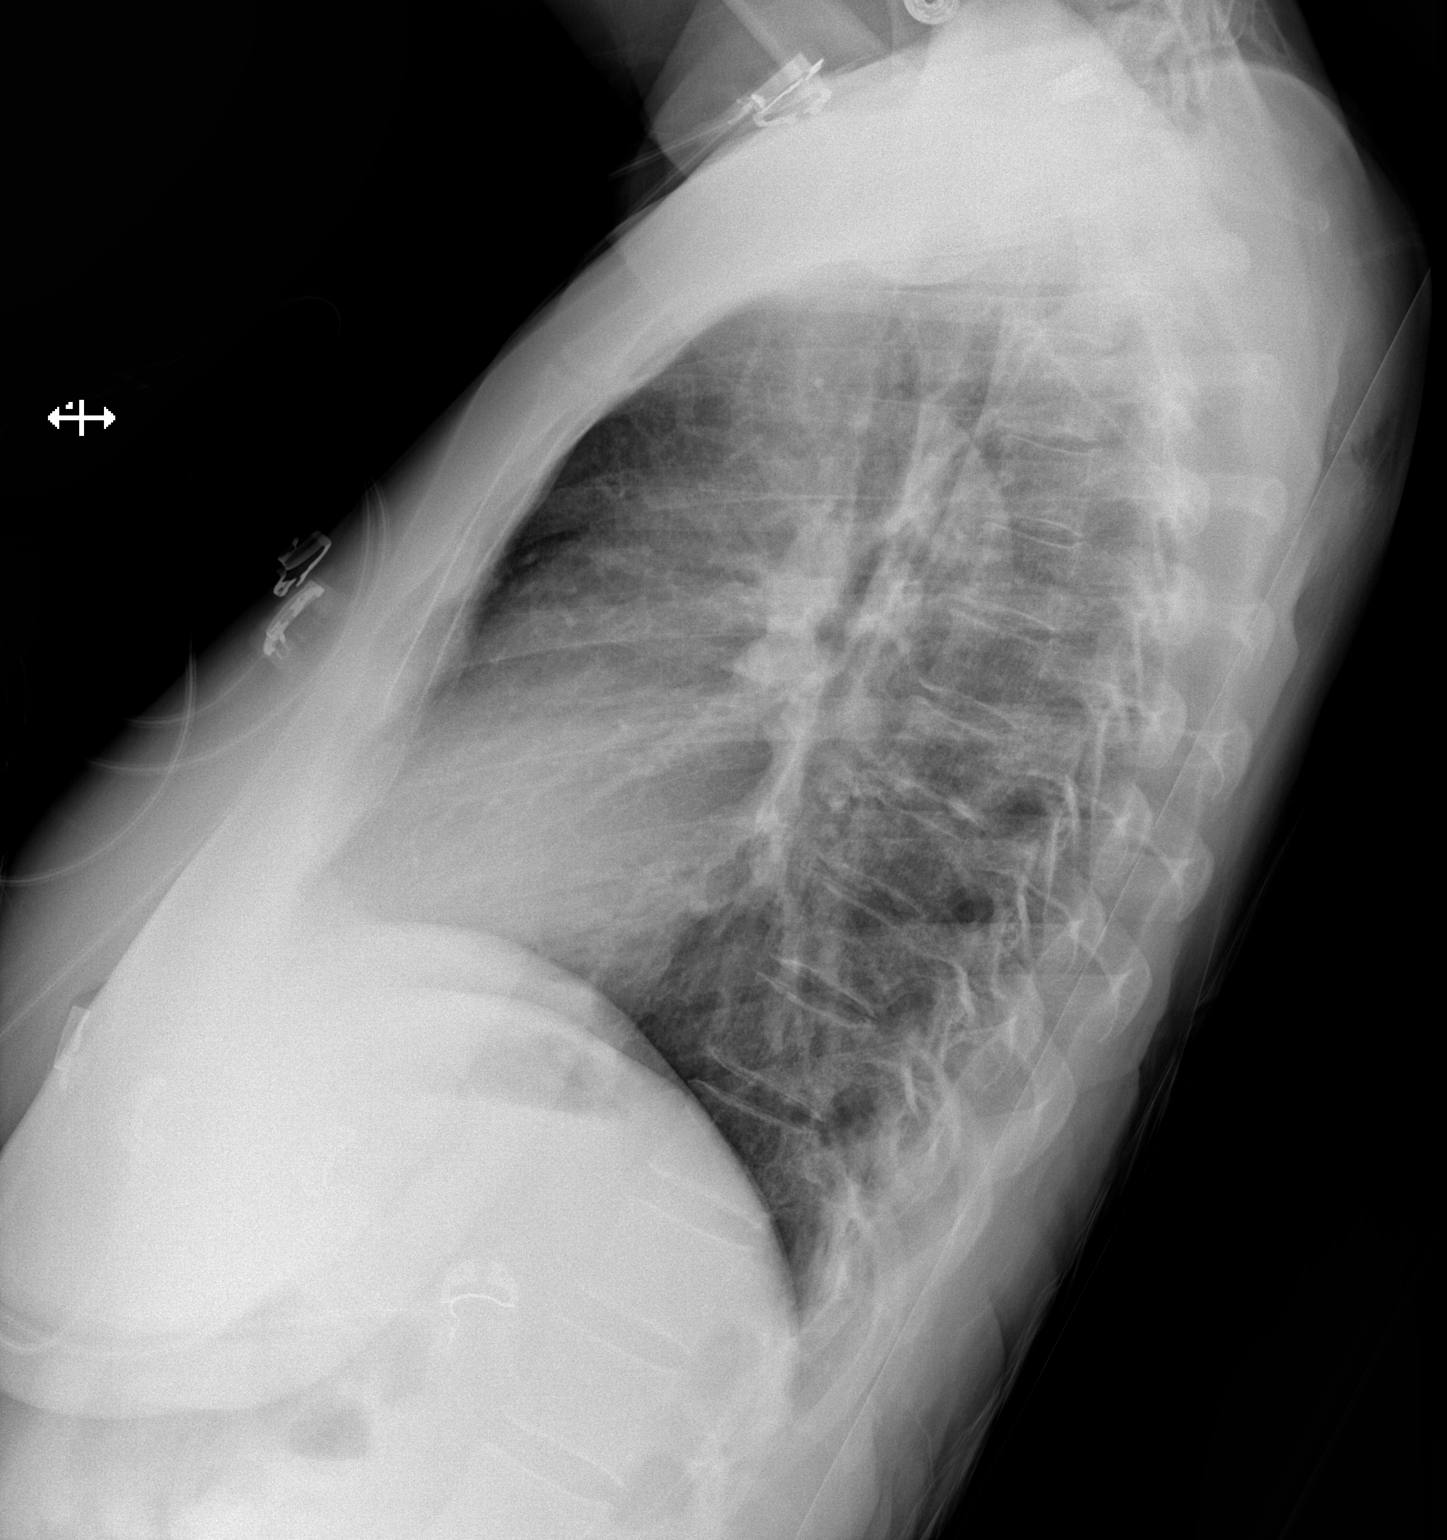

[x chest ap]
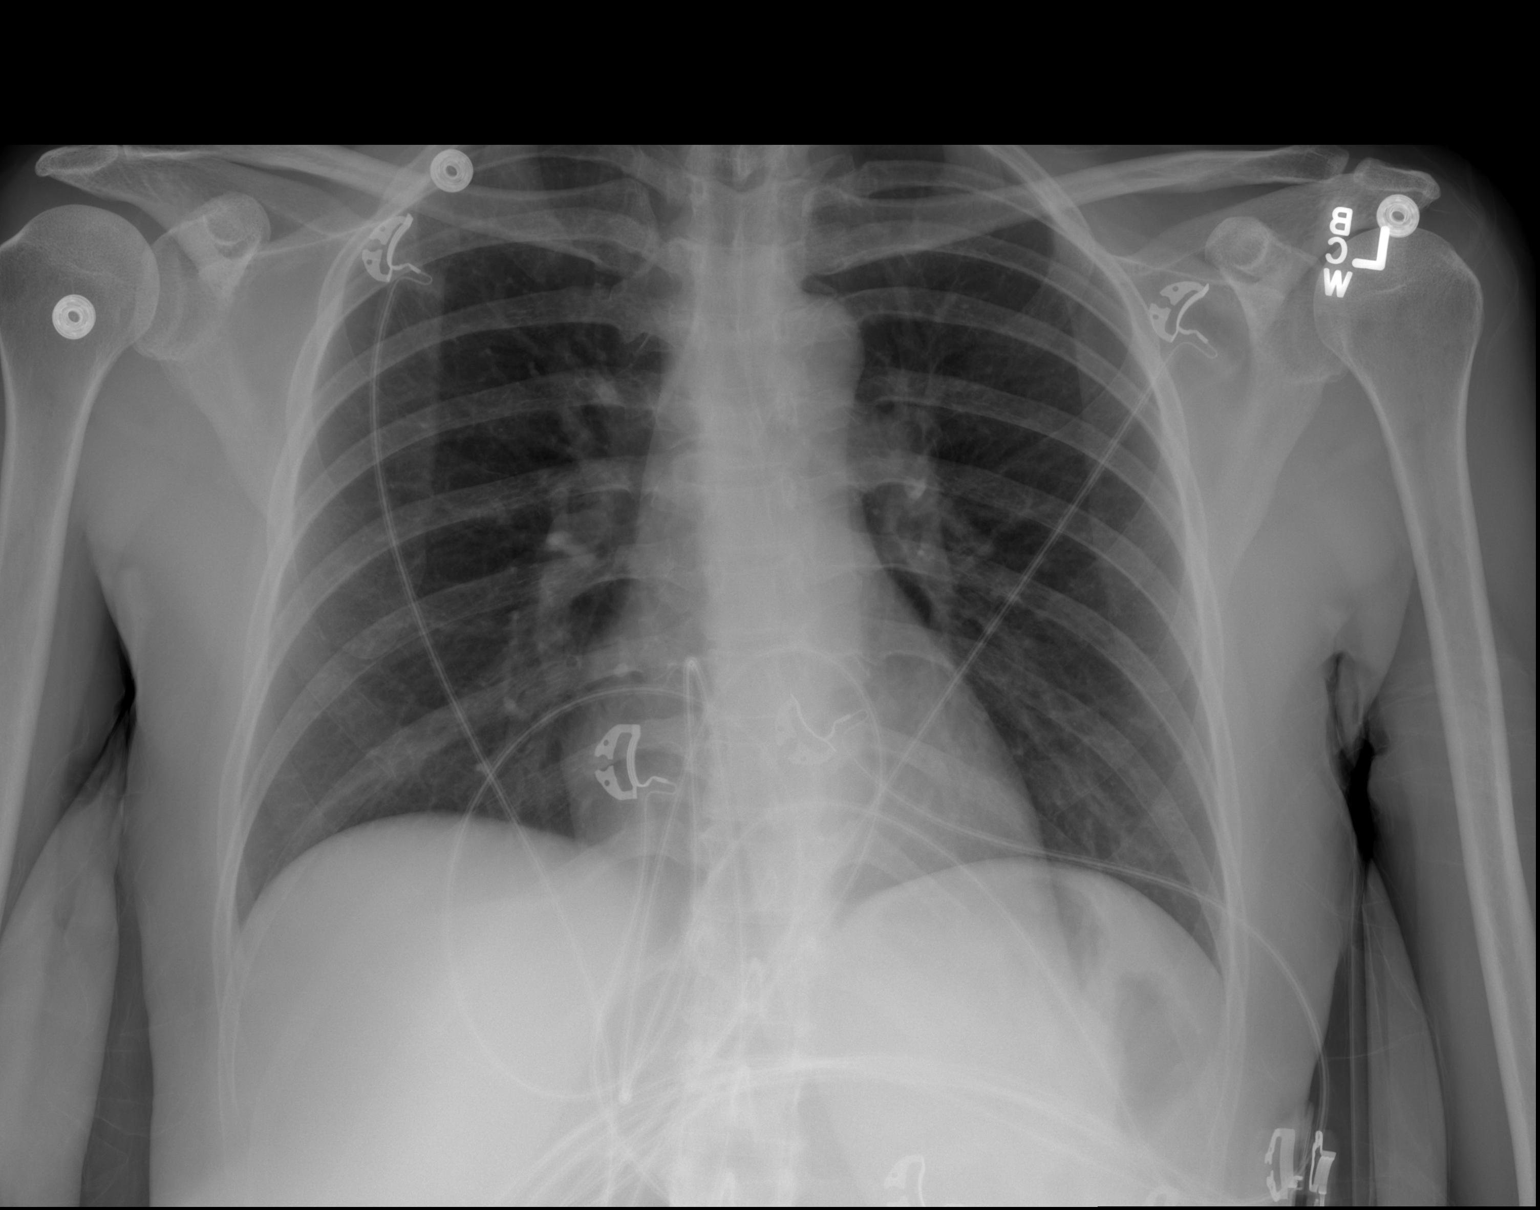

[2 of 2 positions shown; findings below may reference images not displayed]

FINDINGS: The heart size and mediastinal contours are within normal limits.
Both lungs are clear. The visualized skeletal structures are
unremarkable.
IMPRESSION: No active cardiopulmonary disease.

## 2017-11-07 IMAGING — CR DG CHEST 2V
2 series · 2 of 2 positions shown · non-contrast
Comparison: Chest radiograph dated 03/26/2016

CLINICAL DATA: 45-year-old female with fever, congestion, and body
aches. History of breast cancer with chemotherapy.

EXAM:
CHEST  2 VIEW

[w chest pa]
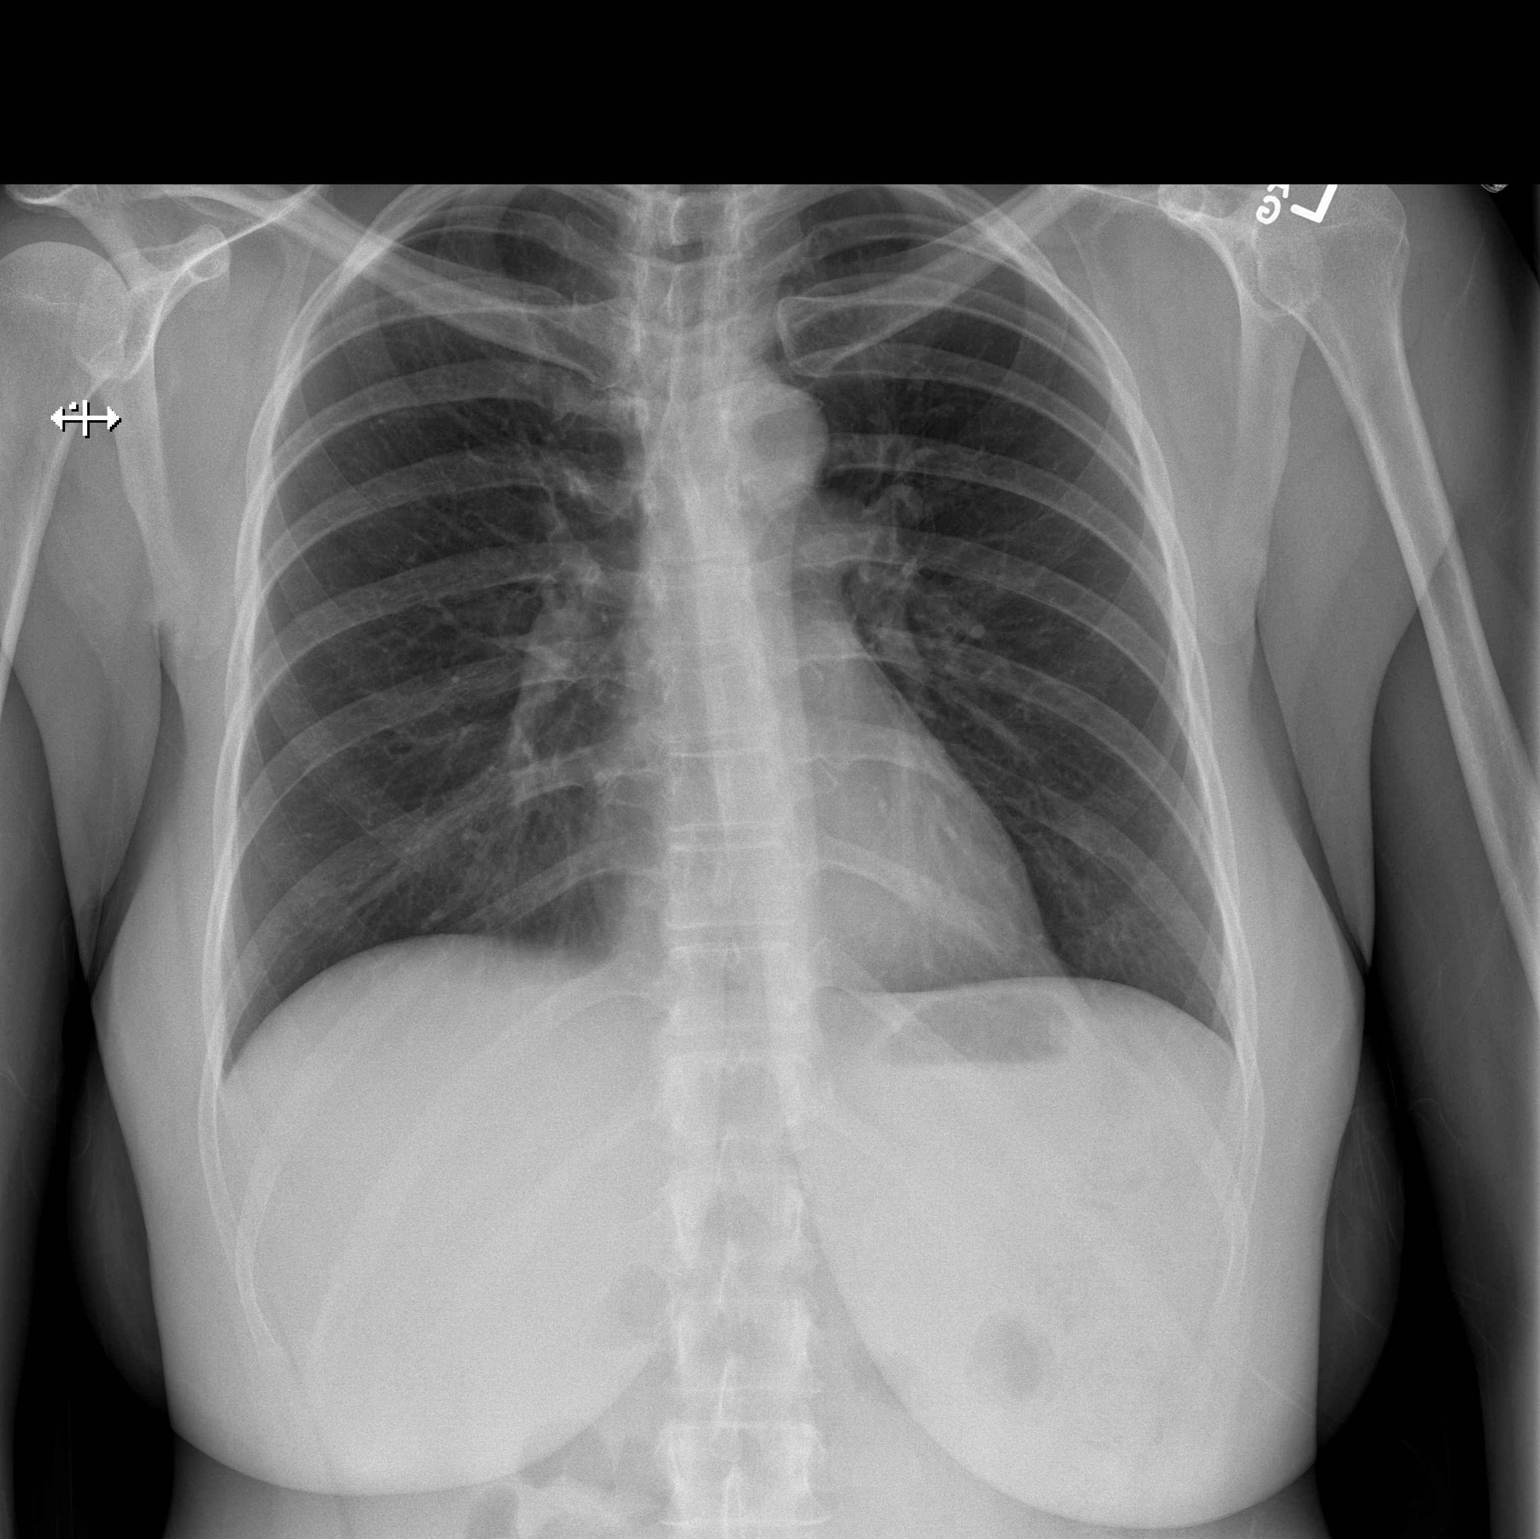

[w chest lat]
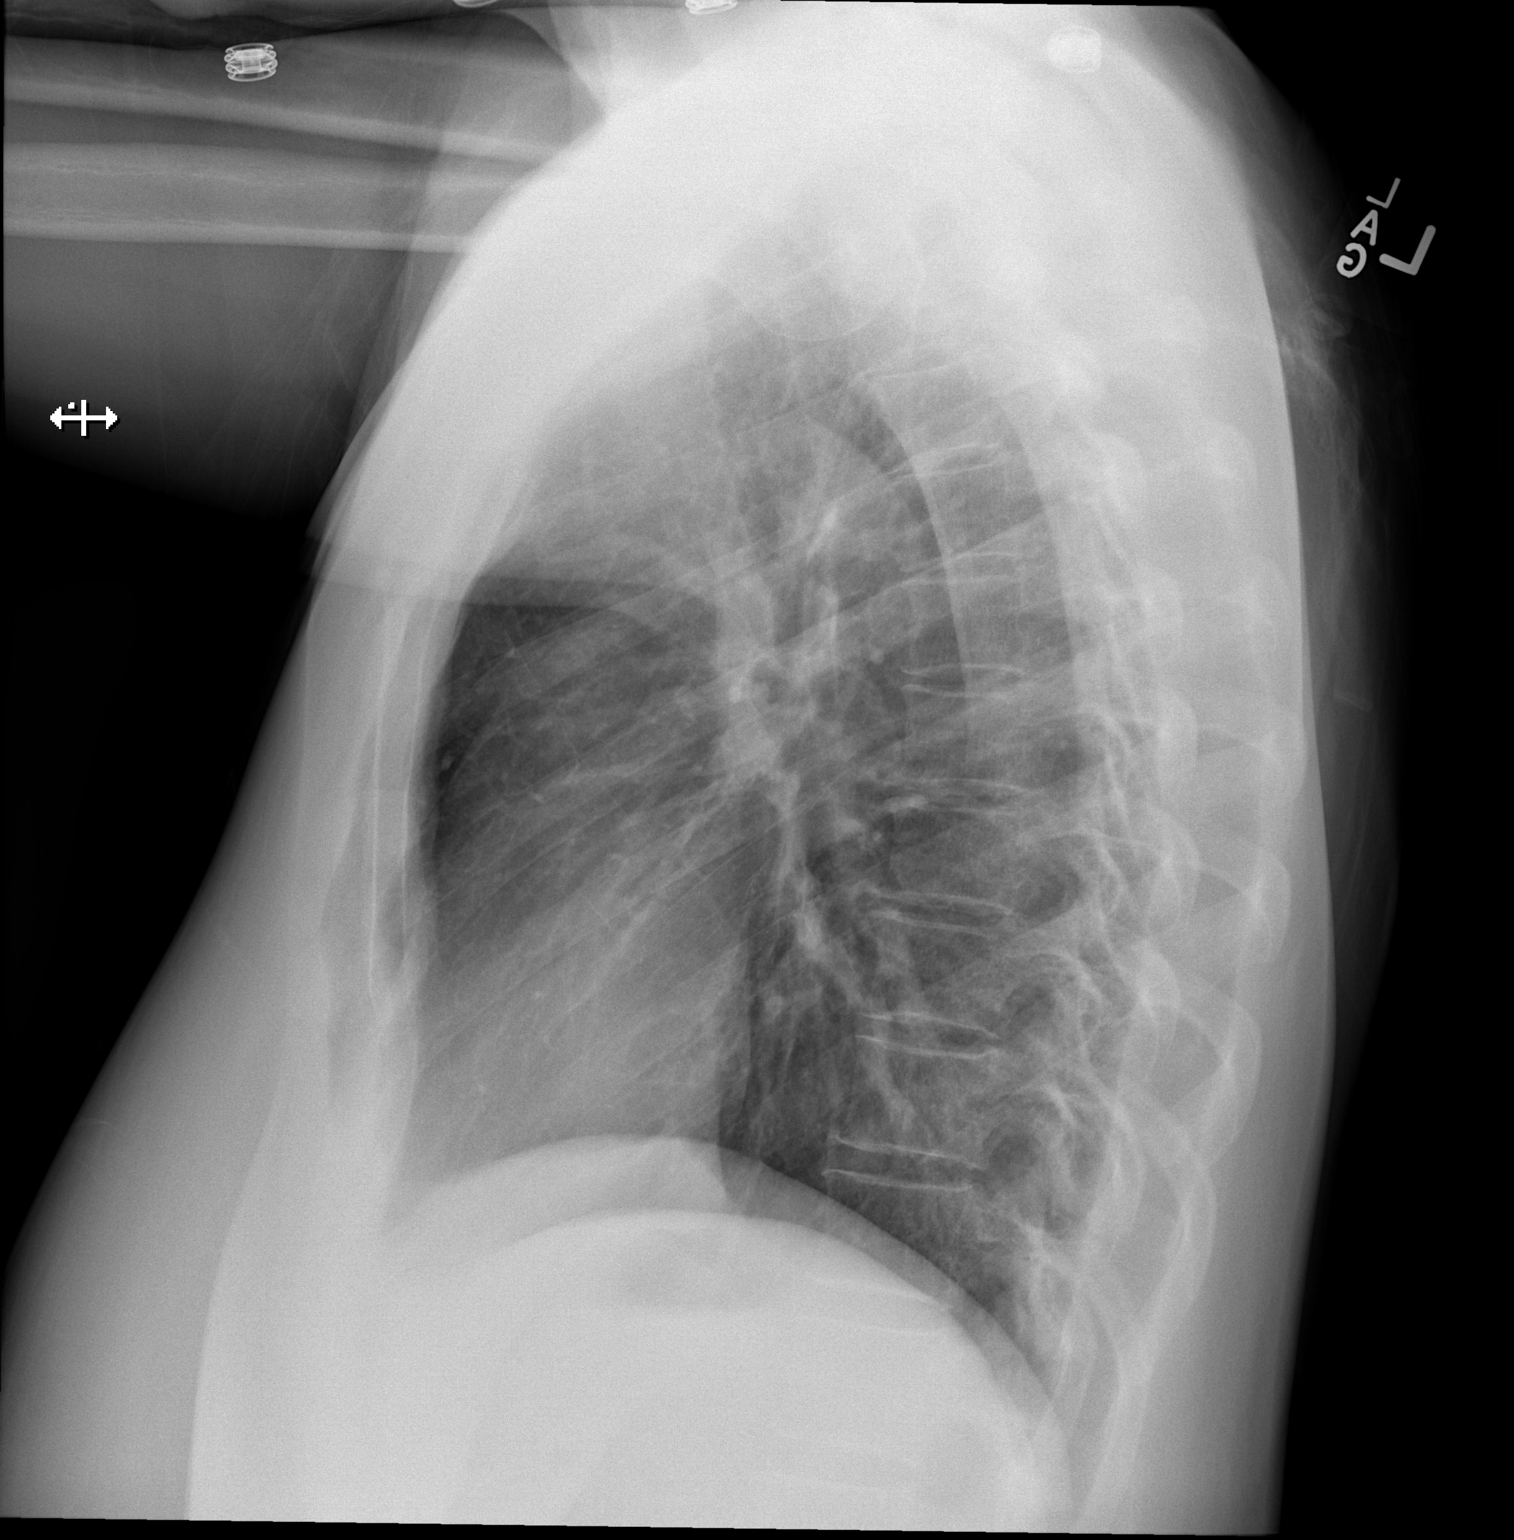

[2 of 2 positions shown; findings below may reference images not displayed]

FINDINGS: The lungs are clear. There is no pleural effusion or pneumothorax.
The cardiac silhouette is within normal limits. No acute osseous
pathology.
IMPRESSION: No active cardiopulmonary disease.

## 2018-02-22 ENCOUNTER — Other Ambulatory Visit: Payer: Self-pay | Admitting: Nurse Practitioner

## 2020-02-22 ENCOUNTER — Ambulatory Visit: Payer: 59 | Attending: Internal Medicine

## 2020-02-22 DIAGNOSIS — Z23 Encounter for immunization: Secondary | ICD-10-CM

## 2020-02-22 NOTE — Progress Notes (Signed)
   Covid-19 Vaccination Clinic  Name:  Liviya Santini    MRN: [id number] DOB: 1970/08/10  02/22/2020  Ms. Piscopo was observed post Covid-19 immunization for 15 minutes without incident. She was provided with Vaccine Information Sheet and instruction to access the V-Safe system.   Ms. Funches was instructed to call 911 with any severe reactions post vaccine: Marland Kitchen Difficulty breathing  . Swelling of face and throat  . A fast heartbeat  . A bad rash all over body  . Dizziness and weakness   Immunizations Administered    No immunizations on file.

## 2020-04-06 ENCOUNTER — Encounter (HOSPITAL_COMMUNITY): Payer: Self-pay

## 2020-04-06 ENCOUNTER — Other Ambulatory Visit: Payer: Self-pay

## 2020-04-06 ENCOUNTER — Emergency Department (HOSPITAL_COMMUNITY)
Admission: EM | Admit: 2020-04-06 | Discharge: 2020-04-06 | Disposition: A | Discharge status: Home / Self Care | Payer: BC Managed Care – PPO | Attending: Emergency Medicine | Admitting: Emergency Medicine

## 2020-04-06 DIAGNOSIS — R0602 Shortness of breath: Secondary | ICD-10-CM | POA: Diagnosis not present

## 2020-04-06 DIAGNOSIS — R002 Palpitations: Secondary | ICD-10-CM | POA: Diagnosis not present

## 2020-04-06 DIAGNOSIS — Z5321 Procedure and treatment not carried out due to patient leaving prior to being seen by health care provider: Secondary | ICD-10-CM | POA: Diagnosis not present

## 2020-04-06 DIAGNOSIS — R55 Syncope and collapse: Secondary | ICD-10-CM | POA: Diagnosis not present

## 2020-04-06 LAB — BASIC METABOLIC PANEL
Anion gap: 9 (ref 5–15)
BUN: 17 mg/dL (ref 6–20)
CO2: 26 mmol/L (ref 22–32)
Calcium: 9.8 mg/dL (ref 8.9–10.3)
Chloride: 104 mmol/L (ref 98–111)
Creatinine, Ser: 1.22 mg/dL — ABNORMAL HIGH (ref 0.44–1.00)
GFR, Estimated: 54 mL/min — ABNORMAL LOW (ref >60)
Glucose, Bld: 101 mg/dL — ABNORMAL HIGH (ref 70–99)
Potassium: 4.8 mmol/L (ref 3.5–5.1)
Sodium: 139 mmol/L (ref 135–145)

## 2020-04-06 LAB — CBC
HCT: 41.7 % (ref 36.0–46.0)
Hemoglobin: 13.9 g/dL (ref 12.0–15.0)
MCH: 31.5 pg (ref 26.0–34.0)
MCHC: 33.3 g/dL (ref 30.0–36.0)
MCV: 94.6 fL (ref 80.0–100.0)
Platelets: 168 K/uL (ref 150–400)
RBC: 4.41 MIL/uL (ref 3.87–5.11)
RDW: 11.9 % (ref 11.5–15.5)
WBC: 3.7 K/uL — ABNORMAL LOW (ref 4.0–10.5)
nRBC: 0 % (ref 0.0–0.2)

## 2020-04-06 LAB — I-STAT BETA HCG BLOOD, ED (MC, WL, AP ONLY): I-stat hCG, quantitative: <5 m[IU]/mL

## 2020-04-06 NOTE — ED Triage Notes (Signed)
Patient arrived stating tonight she started feeling like she was having heart palpitations for about 30 minutes then began to feel short of breath like she was going to pass out.  Declines any pain.

## 2020-04-10 NOTE — Progress Notes (Signed)
Cardiology Office Note:    Date:  04/11/2020   ID:  Jeanette Macdonald, DOB May 04, 1970, MRN [id number]  PCP:  Patient, No Pcp Per  [place] Cardiologist:  No primary care provider on file.  [facility] Electrophysiologist:  None   Referring MD: No ref. provider found    History of Present Illness:    Jeanette Macdonald is a 50 y.o. female with a hx of breast cancer s/p chemo who was referred to clinic by her primary provider for further evaluation of palpitations.  Patient last saw Dr. Haroldine Laws in 2017 as part of the cardio-oncology program. 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 At that time, she was scheduled to undergo neoadjuvant chemotherapy with dose dense Adriamycin and Cytoxan, every 2 weeks, 4 cycles, followed by weekly Taxol for 12 weeks.Echo 01/06/16 EF 78-29% normal diastolic parameters. Normal RV. Lat s' 13/1 cm/sec. GLS -21.2%. Has not been seen since that time.  On 04/06/20, the patient presented to the ED with palpitations with associated lightheadedness that lasted for 2 hours.  Specifically, the patient states that she was just sitting on the couch and she develop palpitations.  She has had this before and usually resolved with deep breathing, however, this time it did not break. She went to the ER and her palpitations resolved. ECG showed NSR without any ectopy but she was not having symptoms at the time of the tracing. She was sitting in the ER lobby for hours and her symptoms had resolved by then so she decided to leave prior to been seen by a MD. Labs notable for Cr 1.2, but otherwise no anemia or electrolyte abnormalities.   The patient admits that she drinks 4 cups of coffee a day. On the day of her palpitations, she did not drink much water and did have a white wine spritzer which she thinks triggered the symptoms. Since leaving the ER, she has occasional  palpitations but they resolve after a couple of seconds. She states that these episodes usually correlate with her anxiety related to her breast cancer and she has attributed them to stress in the past. No associated chest pain, shortness of breath, orthopnea, PND or edema. She is active and is able to exercise daily without issues.    Past Medical History:  Diagnosis Date  . Abscess    perianal  . Anxiety    Flying  . History of hepatitis B    20+ years ago  . Malignant neoplasm of lower-inner quadrant of left female breast ([place]) 12/29/2015    Past Surgical History:  Procedure Laterality Date  . CESAREAN SECTION  02/02/08    Current Medications: Current Meds  Medication Sig  . ALPRAZolam (XANAX) 0.5 MG tablet Take 1 tablet (0.5 mg total) by mouth at bedtime as needed for sleep or anxiety.  Marland Kitchen anastrozole (ARIMIDEX) 1 MG tablet Take 1 mg by mouth daily.  . Azelaic Acid 15 % cream Apply 1 application topically every evening. After skin is thoroughly washed and patted dry, gently but thoroughly massage a thin film of azelaic acid cream into the affected area twice daily, in the morning and evening.  . Vitamin D, Ergocalciferol, (DRISDOL) 1.25 MG (50000 UNIT) CAPS capsule Take 50,000 Units by mouth every 7 (seven) days.     Allergies:   Docetaxel, Penicillins, and Vancomycin   Social History   Socioeconomic History  . Marital status: Married  Spouse name: Not on file  . Number of children: 1  . Years of education: Not on file  . Highest education level: Not on file  Occupational History  . Occupation: Scientist, research (medical): [employer]  Tobacco Use  . Smoking status: Never Smoker  . Smokeless tobacco: Never Used  Substance and Sexual Activity  . Alcohol use: No    Comment: occasionally   . Drug use: No  . Sexual activity: Yes    Birth control/protection: Surgical    Comment: husband vasectomy   Other Topics Concern  . Not on file  Social History Narrative  . Not on file    Social Determinants of Health   Financial Resource Strain: Not on file  Food Insecurity: Not on file  Transportation Needs: Not on file  Physical Activity: Not on file  Stress: Not on file  Social Connections: Not on file     Family History: The patient's family history includes Cancer in her maternal aunt, maternal grandfather, maternal grandmother, paternal grandfather, and paternal grandmother. There is no history of CAD.  ROS:   Please see the history of present illness.    Review of Systems  Constitutional: Negative for chills and fever.  HENT: Negative for congestion.   Eyes: Negative for double vision.  Respiratory: Negative for shortness of breath.   Cardiovascular: Positive for palpitations. Negative for chest pain, orthopnea, claudication, leg swelling and PND.  Gastrointestinal: Negative for nausea and vomiting.  Genitourinary: Negative for hematuria.  Musculoskeletal: Negative for falls.  Neurological: Positive for dizziness. Negative for loss of consciousness.  Endo/Heme/Allergies: Negative for polydipsia.  Psychiatric/Behavioral: Negative for substance abuse.    EKGs/Labs/Other Studies Reviewed:    The following studies were reviewed today: TTE 06/15/15: Study Conclusions  - Left ventricle: The cavity size was normal. Systolic function was  normal. The estimated ejection fraction was in the range of 60%  to 65%. Wall motion was normal; there were no regional wall  motion abnormalities. Left ventricular diastolic function  parameters were normal.   EKG:  EKG 04/07/20: NSR with RSR pattern, no ischemia or block  Recent Labs: 04/06/2020: BUN 17; Creatinine, Ser 1.22; Hemoglobin 13.9; Platelets 168; Potassium 4.8; Sodium 139  Recent Lipid Panel No results found for: CHOL, TRIG, HDL, CHOLHDL, VLDL, LDLCALC, LDLDIRECT   Physical Exam:    VS:  BP 110/74   Pulse 76   Ht '5\' 4"'  (1.626 m)   Wt 152 lb (68.9 kg)   SpO2 94%   BMI 26.09 kg/m     Wt  Readings from Last 3 Encounters:  04/11/20 152 lb (68.9 kg)  04/06/20 145 lb (65.8 kg)  05/08/16 135 lb (61.2 kg)     GEN:  Well nourished, well developed in no acute distress HEENT: Normal NECK: No JVD; No carotid bruits LYMPHATICS: No lymphadenopathy CARDIAC: RRR, 2/6 systolic murmur RESPIRATORY:  Clear to auscultation without rales, wheezing or rhonchi  ABDOMEN: Soft, non-tender, non-distended MUSCULOSKELETAL:  No edema; No deformity  SKIN: Warm and dry NEUROLOGIC:  Alert and oriented x 3 PSYCHIATRIC:  Normal affect   ASSESSMENT:    1. Palpitations   2. Malignant neoplasm of left female breast, unspecified estrogen receptor status, unspecified site of breast ([place])    PLAN:    In order of problems listed above:  #Palpitations: Patient with 2 hour episode of palptaitons in the setting of dehydration and drinking wine and 4 cups of coffee. Resolved prior to evaluation in the ER. Last TTE  in 2017 with normal BiV function, no significant valvular abnormalities. During episode, she had some lightheadedness but no chest pain or SOB. She is active and able to exercise without limitations. Has had intermittent palpitations for years that she attributes to stress but they usually resolve with deep breathing until this last episode as detailed above.  -Check 2 week zio monitor -Pending findings, can consider BB  #History of breast cancer s/p chemo/radiation: In remission. Last TTE in 2017 with normal BiV function and no significant valvular abnormalities.  -Will need repeat TTE in the next year for surveillance   Medication Adjustments/Labs and Tests Ordered: Current medicines are reviewed at length with the patient today.  Concerns regarding medicines are outlined above.  Orders Placed This Encounter  Procedures  . LONG TERM MONITOR (3-14 DAYS)   No orders of the defined types were placed in this encounter.   Patient Instructions  Medication Instructions:   Your physician  recommends that you continue on your current medications as directed. Please refer to the Current Medication list given to you today.  *If you need a refill on your cardiac medications before your next appointment, please call your pharmacy*   Testing/Procedures:  ZIO XT- Long Term Monitor Instructions   Your physician has requested you wear your ZIO patch monitor__14_____days.   This is a single patch monitor.  [employer] supplies one patch monitor per enrollment.  Additional stickers are not available.   Please do not apply patch if you will be having a Nuclear Stress Test, Echocardiogram, Cardiac CT, MRI, or Chest Xray during the time frame you would be wearing the monitor. The patch cannot be worn during these tests.  You cannot remove and re-apply the ZIO XT patch monitor.   Your ZIO patch monitor will be sent USPS Priority mail from [facility] directly to your home address. The monitor may also be mailed to a PO BOX if home delivery is not available.   It may take 3-5 days to receive your monitor after you have been enrolled.   Once you have received you monitor, please review enclosed instructions.  Your monitor has already been registered assigning a specific monitor serial # to you.   Applying the monitor   Shave hair from upper left chest.   Hold abrader disc by orange tab.  Rub abrader in 40 strokes over left upper chest as indicated in your monitor instructions.   Clean area with 4 enclosed alcohol pads .  Use all pads to assure are is cleaned thoroughly.  Let dry.   Apply patch as indicated in monitor instructions.  Patch will be place under collarbone on left side of chest with arrow pointing upward.   Rub patch adhesive wings for 2 minutes.Remove white label marked "1".  Remove white label marked "2".  Rub patch adhesive wings for 2 additional minutes.   While looking in a mirror, press and release button in center of patch.  A small green light will flash 3-4  times .  This will be your only indicator the monitor has been turned on.     Do not shower for the first 24 hours.  You may shower after the first 24 hours.   Press button if you feel a symptom. You will hear a small click.  Record Date, Time and Symptom in the Patient Log Book.   When you are ready to remove patch, follow instructions on last 2 pages of Patient Log Book.  Stick patch monitor onto  last page of Patient Log Book.   Place Patient Log Book in Tensed box.  Use locking tab on box and tape box closed securely.  The [employer] and [employer] has [employer] on it.  Please place in mailbox as soon as possible.  Your physician should have your test results approximately 7 days after the monitor has been mailed back to [facility].   Call [place] at [phone] if you have questions regarding your ZIO XT patch monitor.  Call them immediately if you see an orange light blinking on your monitor.   If your monitor falls off in less than 4 days contact our Monitor department at [phone].  If your monitor becomes loose or falls off after 4 days call [employer] at [phone] for suggestions on securing your monitor.    Follow-Up: At [facility], you and your health needs are our priority.  As part of our continuing mission to provide you with exceptional heart care, we have created designated Provider Care Teams.  These Care Teams include your primary Cardiologist (physician) and Advanced Practice Providers (APPs -  Physician Assistants and Nurse Practitioners) who all work together to provide you with the care you need, when you need it.  We recommend signing up for the patient portal called "MyChart".  Sign up information is provided on this After Visit Summary.  MyChart is used to connect with patients for Virtual Visits (Telemedicine).  Patients are able to view lab/test results, encounter notes, upcoming appointments, etc.  Non-urgent messages can be sent to  your provider as well.   To learn more about what you can do with MyChart, go to [email or web address].    Your next appointment:   6 month(s)  The format for your next appointment:   In Person  Provider:   Gwyndolyn Kaufman, MD        Signed, Freada Bergeron, MD  04/11/2020 11:59 AM    [place]

## 2020-04-11 ENCOUNTER — Ambulatory Visit (INDEPENDENT_AMBULATORY_CARE_PROVIDER_SITE_OTHER): Payer: BC Managed Care – PPO | Admitting: Cardiology

## 2020-04-11 ENCOUNTER — Encounter: Payer: Self-pay | Admitting: *Deleted

## 2020-04-11 ENCOUNTER — Other Ambulatory Visit: Payer: Self-pay | Admitting: Cardiology

## 2020-04-11 ENCOUNTER — Encounter: Payer: Self-pay | Admitting: Cardiology

## 2020-04-11 ENCOUNTER — Other Ambulatory Visit: Payer: Self-pay

## 2020-04-11 ENCOUNTER — Ambulatory Visit (INDEPENDENT_AMBULATORY_CARE_PROVIDER_SITE_OTHER): Payer: BC Managed Care – PPO

## 2020-04-11 VITALS — BP 110/74 | HR 76 | Ht 64.0 in | Wt 152.0 lb

## 2020-04-11 DIAGNOSIS — R002 Palpitations: Secondary | ICD-10-CM | POA: Diagnosis not present

## 2020-04-11 DIAGNOSIS — C50912 Malignant neoplasm of unspecified site of left female breast: Secondary | ICD-10-CM | POA: Diagnosis not present

## 2020-04-11 NOTE — Progress Notes (Signed)
Patient ID: Jeanette Macdonald, female   DOB: October 24, 1970, 50 y.o.   MRN: [id number] Patient enrolled for [employer] to ship a 14 day ZIO XT long term holter monitor to her home.

## 2020-04-11 NOTE — Patient Instructions (Signed)
Medication Instructions:   Your physician recommends that you continue on your current medications as directed. Please refer to the Current Medication list given to you today.  *If you need a refill on your cardiac medications before your next appointment, please call your pharmacy*   Testing/Procedures:  ZIO XT- Long Term Monitor Instructions   Your physician has requested you wear your ZIO patch monitor__14_____days.   This is a single patch monitor.  [employer] supplies one patch monitor per enrollment.  Additional stickers are not available.   Please do not apply patch if you will be having a Nuclear Stress Test, Echocardiogram, Cardiac CT, MRI, or Chest Xray during the time frame you would be wearing the monitor. The patch cannot be worn during these tests.  You cannot remove and re-apply the ZIO XT patch monitor.   Your ZIO patch monitor will be sent USPS Priority mail from [facility] directly to your home address. The monitor may also be mailed to a PO BOX if home delivery is not available.   It may take 3-5 days to receive your monitor after you have been enrolled.   Once you have received you monitor, please review enclosed instructions.  Your monitor has already been registered assigning a specific monitor serial # to you.   Applying the monitor   Shave hair from upper left chest.   Hold abrader disc by orange tab.  Rub abrader in 40 strokes over left upper chest as indicated in your monitor instructions.   Clean area with 4 enclosed alcohol pads .  Use all pads to assure are is cleaned thoroughly.  Let dry.   Apply patch as indicated in monitor instructions.  Patch will be place under collarbone on left side of chest with arrow pointing upward.   Rub patch adhesive wings for 2 minutes.Remove white label marked "1".  Remove white label marked "2".  Rub patch adhesive wings for 2 additional minutes.   While looking in a mirror, press and release button in center of  patch.  A small green light will flash 3-4 times .  This will be your only indicator the monitor has been turned on.     Do not shower for the first 24 hours.  You may shower after the first 24 hours.   Press button if you feel a symptom. You will hear a small click.  Record Date, Time and Symptom in the Patient Log Book.   When you are ready to remove patch, follow instructions on last 2 pages of Patient Log Book.  Stick patch monitor onto last page of Patient Log Book.   Place Patient Log Book in [place] box.  Use locking tab on box and tape box closed securely.  The [employer] has [employer] on it.  Please place in mailbox as soon as possible.  Your physician should have your test results approximately 7 days after the monitor has been mailed back to [facility]   Call [place] at [phone] if you have questions regarding your ZIO XT patch monitor.  Call them immediately if you see an orange light blinking on your monitor.   If your monitor falls off in less than 4 days contact our Monitor department at [phone].  If your monitor becomes loose or falls off after 4 days call [employer] at [phone] for suggestions on securing your monitor.    Follow-Up: At [facility], you and your health needs are our priority.  As part  of our continuing mission to provide you with exceptional heart care, we have created designated Provider Care Teams.  These Care Teams include your primary Cardiologist (physician) and Advanced Practice Providers (APPs -  Physician Assistants and Nurse Practitioners) who all work together to provide you with the care you need, when you need it.  We recommend signing up for the patient portal called "MyChart".  Sign up information is provided on this After Visit Summary.  MyChart is used to connect with patients for Virtual Visits (Telemedicine).  Patients are able to view lab/test results, encounter notes, upcoming appointments,  etc.  Non-urgent messages can be sent to your provider as well.   To learn more about what you can do with MyChart, go to [email or web address].    Your next appointment:   6 month(s)  The format for your next appointment:   In Person  Provider:   Gwyndolyn Kaufman, MD

## 2020-05-27 ENCOUNTER — Ambulatory Visit (INDEPENDENT_AMBULATORY_CARE_PROVIDER_SITE_OTHER): Payer: BC Managed Care – PPO | Admitting: Plastic Surgery

## 2020-05-27 ENCOUNTER — Encounter: Payer: Self-pay | Admitting: Plastic Surgery

## 2020-05-27 ENCOUNTER — Other Ambulatory Visit: Payer: Self-pay

## 2020-05-27 VITALS — BP 130/84 | HR 76 | Ht 63.0 in | Wt 148.2 lb

## 2020-05-27 DIAGNOSIS — N6489 Other specified disorders of breast: Secondary | ICD-10-CM

## 2020-05-27 DIAGNOSIS — C50912 Malignant neoplasm of unspecified site of left female breast: Secondary | ICD-10-CM

## 2020-05-27 NOTE — Progress Notes (Signed)
Patient ID: Jeanette Macdonald, female    DOB: 1970/11/18, 50 y.o.   MRN: [id number]   Chief Complaint  Patient presents with  . consult  . Breast Problem    Mammary Hyperplasia: The patient is a 50 y.o. female with a history of mammary hyperplasia for several years.  She has extremely large breasts causing symptoms that include the following: Back pain in the upper and lower back, including neck pain. She pulls or pins her bra straps to provide better lift and relief of the pressure and pain. She notices relief by holding her breast up manually.  Her shoulder straps cause grooves and pain and pressure that requires padding for relief. Pain medication is sometimes required with motrin and tylenol.  Activities that are hindered by enlarged breasts include: exercise and running.  She has tried supportive clothing as well as fitted bras without improvement.  Her breasts are extremely large asymmetric due to her left partial mastectomy and radiation.  She has hyperpigmentation of the inframammary area on both sides.  The sternal to nipple distance on the right is 35 cm and the left is 33 cm.  The IMF distance is 15 cm.  She is 5 feet 3 inches tall and weighs 148 pounds.  Her BMI is 26.2 kg/m2.  Preoperative bra size = H cup.  She would like to be a B/C cup.  The estimated excess breast tissue to be removed at the time of surgery = 450 grams on the left and 450 grams on the right.  Mammogram history: She gets mammograms every 6 months at [facility].  Family history of breast cancer: Negative.  Tobacco use: None.  She does not have a history of diabetes or blood thinner.  She has been to a Restaurant manager, fast food.  She does complain of neck back pain shoulder pain and shoulder grooving.  She gets rashes occasionally and has to use hydrocortisone cream or antifungal cream.  She had a left partial mastectomy in 2018 and finished the radiation at that time.  She is also had a C-section.  She is otherwise in very good health.  She  still gets her post cancer follow-up at [facility].    Review of Systems  Constitutional: Negative.   HENT: Negative.   Eyes: Negative.   Respiratory: Negative.   Cardiovascular: Negative.  Negative for leg swelling.  Gastrointestinal: Negative for abdominal distention and abdominal pain.  Endocrine: Negative.   Genitourinary: Negative.   Musculoskeletal: Positive for back pain and neck pain.  Hematological: Negative.   Psychiatric/Behavioral: Negative.     Past Medical History:  Diagnosis Date  . Abscess    perianal  . Anxiety    Flying  . History of hepatitis B    20+ years ago  . Malignant neoplasm of lower-inner quadrant of left female breast ([place]) 12/29/2015    Past Surgical History:  Procedure Laterality Date  . CESAREAN SECTION  02/02/08      Current Outpatient Medications:  .  ALPRAZolam (XANAX) 0.5 MG tablet, Take 1 tablet (0.5 mg total) by mouth at bedtime as needed for sleep or anxiety., Disp: 20 tablet, Rfl: 0 .  anastrozole (ARIMIDEX) 1 MG tablet, Take 1 mg by mouth daily., Disp: , Rfl:  .  Azelaic Acid 15 % cream, Apply 1 application topically every evening. After skin is thoroughly washed and patted dry, gently but thoroughly massage a thin film of azelaic acid cream into the affected area twice daily, in the morning and evening.,  Disp: , Rfl:  .  Vitamin D, Ergocalciferol, (DRISDOL) 1.25 MG (50000 UNIT) CAPS capsule, Take 50,000 Units by mouth every 7 (seven) days., Disp: , Rfl:    Objective:   Vitals:   05/27/20 1010  BP: 130/84  Pulse: 76  SpO2: 96%    Physical Exam Vitals and nursing note reviewed.  Constitutional:      Appearance: Normal appearance.  HENT:     Head: Normocephalic and atraumatic.  Cardiovascular:     Rate and Rhythm: Normal rate.     Pulses: Normal pulses.  Pulmonary:     Effort: Pulmonary effort is normal.  Abdominal:     General: Abdomen is flat.     Palpations: Abdomen is soft.  Musculoskeletal:        General: No  swelling or deformity. Normal range of motion.  Skin:    General: Skin is warm.  Neurological:     General: No focal deficit present.     Mental Status: She is alert and oriented to person, place, and time.  Psychiatric:        Mood and Affect: Mood normal.        Behavior: Behavior normal.        Thought Content: Thought content normal.     Assessment & Plan:  Malignant neoplasm of left female breast, unspecified estrogen receptor status, unspecified site of breast ([facility])  Postoperative breast asymmetry  We had a long discussion about the risks of any kind of surgical intervention on tissue that is been radiated.  I think she is a very good candidate for bilateral breast reduction.  She understands that she is at a much higher risk of not healing the left breast due to the radiation.  This would likely lead to months wound care and dressing changes.  I think in the end she would be able to heal it.  I recommend increasing her protein and decreasing her carbohydrates in her sugars between now and surgery.  Also recommend a multivitamin and vitamin C.  Plan for bilateral breast reduction for symmetry. Pictures were obtained of the patient and placed in the chart with the patient's or guardian's permission.   [place], DO

## 2020-08-24 ENCOUNTER — Ambulatory Visit (INDEPENDENT_AMBULATORY_CARE_PROVIDER_SITE_OTHER): Payer: BC Managed Care – PPO | Admitting: Surgical

## 2020-08-24 ENCOUNTER — Other Ambulatory Visit: Payer: Self-pay

## 2020-08-24 ENCOUNTER — Encounter: Payer: Self-pay | Admitting: Surgical

## 2020-08-24 VITALS — BP 126/85 | HR 74 | Ht 64.0 in | Wt 147.0 lb

## 2020-08-24 DIAGNOSIS — C50912 Malignant neoplasm of unspecified site of left female breast: Secondary | ICD-10-CM

## 2020-08-24 DIAGNOSIS — N6489 Other specified disorders of breast: Secondary | ICD-10-CM

## 2020-08-24 MED ORDER — CIPROFLOXACIN HCL 500 MG PO TABS: 500.0000 mg | ORAL_TABLET | Freq: Two times a day (BID) | ORAL | 0 refills | Status: DC

## 2020-08-24 MED ORDER — ONDANSETRON HCL 4 MG PO TABS: 4.0000 mg | ORAL_TABLET | Freq: Three times a day (TID) | ORAL | 0 refills | Status: DC | PRN

## 2020-08-24 NOTE — Progress Notes (Signed)
Patient ID: Jeanette Macdonald, female    DOB: 1971-03-04, 50 y.o.   MRN: [id number]  Chief Complaint  Patient presents with  . Pre-op Exam      ICD-10-CM   1. Malignant neoplasm of left female breast, unspecified estrogen receptor status, unspecified site of breast ([place])  C50.912   2. Postoperative breast asymmetry  N64.89     History of Present Illness: Jeanette Macdonald is a 50 y.o.  female  with a history of macromastia.  She presents for preoperative evaluation for upcoming procedure, Bilateral Breast Reduction, scheduled for 09/22/2020 with Dr.  Marla Roe  The patient has not had problems with anesthesia. No history of DVT/PE.  No family history of DVT/PE.  No family or personal history of bleeding or clotting disorders.  Patient is not currently taking any blood thinners.  No history of CVA/MI.   Summary of Previous Visit: Breasts are extremely large and asymmetric due to her left partial mastectomy and radiation.  STN on the right is 35 cm.  STN on the left is 33 cm.  Preoperative bra size equal H cup.  She would like to be a B/C cup.  Estimated excess tissue to be removed at time of surgery equals 450 g on the left and 450 g on the right. She is not a tobacco user.  She does not have a history of diabetes.  She is not on blood thinners.  She had a left partial mastectomy in 2018 followed by radiation.  She reports that she would like to be as small as safely possible.  Preferably a B cup if possible. She reports that she is aware of possible complications related to the history of radiation and swelling of her left breast.  Past Medical History: Allergies: Allergies  Allergen Reactions  . Docetaxel Shortness Of Breath    chest tightness  . Penicillins Hives    Has patient had a PCN reaction causing immediate rash, facial/tongue/throat swelling, SOB or lightheadedness with hypotension: yes Has patient had a PCN reaction causing severe rash involving mucus membranes or skin necrosis:  no Has patient had a PCN reaction that required hospitalization: no Has patient had a PCN reaction occurring within the last 10 years: no If all of the above answers are "NO", then may proceed with Cephalosporin use.   . Vancomycin     Current Medications:  Current Outpatient Medications:  .  ALPRAZolam (XANAX) 0.5 MG tablet, Take 1 tablet (0.5 mg total) by mouth at bedtime as needed for sleep or anxiety., Disp: 20 tablet, Rfl: 0 .  anastrozole (ARIMIDEX) 1 MG tablet, Take 1 mg by mouth daily., Disp: , Rfl:  .  Azelaic Acid 15 % cream, Apply 1 application topically every evening. After skin is thoroughly washed and patted dry, gently but thoroughly massage a thin film of azelaic acid cream into the affected area twice daily, in the morning and evening., Disp: , Rfl:  .  ciprofloxacin (CIPRO) 500 MG tablet, Take 1 tablet (500 mg total) by mouth 2 (two) times daily., Disp: 6 tablet, Rfl: 0 .  ondansetron (ZOFRAN) 4 MG tablet, Take 1 tablet (4 mg total) by mouth every 8 (eight) hours as needed for nausea or vomiting., Disp: 20 tablet, Rfl: 0 .  Vitamin D, Ergocalciferol, (DRISDOL) 1.25 MG (50000 UNIT) CAPS capsule, Take 50,000 Units by mouth every 7 (seven) days., Disp: , Rfl:   Past Medical Problems: Past Medical History:  Diagnosis Date  . Abscess  perianal  . Anxiety    Flying  . History of hepatitis B    20+ years ago  . Malignant neoplasm of lower-inner quadrant of left female breast ([place]) 12/29/2015    Past Surgical History: Past Surgical History:  Procedure Laterality Date  . CESAREAN SECTION  02/02/08    Social History: Social History   Socioeconomic History  . Marital status: Married    Spouse name: Not on file  . Number of children: 1  . Years of education: Not on file  . Highest education level: Not on file  Occupational History  . Occupation: Scientist, research (medical): [employer]  Tobacco Use  . Smoking status: Never Smoker  . Smokeless tobacco: Never Used   Substance and Sexual Activity  . Alcohol use: No    Comment: occasionally   . Drug use: No  . Sexual activity: Yes    Birth control/protection: Surgical    Comment: husband vasectomy   Other Topics Concern  . Not on file  Social History Narrative  . Not on file   Social Determinants of Health   Financial Resource Strain: Not on file  Food Insecurity: Not on file  Transportation Needs: Not on file  Physical Activity: Not on file  Stress: Not on file  Social Connections: Not on file  Intimate Partner Violence: Not on file    Family History: Family History  Problem Relation Age of Onset  . Cancer Maternal Grandmother        stomach  . Cancer Paternal Grandmother        lung cancer   . Cancer Maternal Aunt        liver cancer  . Cancer Maternal Grandfather        lung cancer  . Cancer Paternal Grandfather        lung cancer  . CAD Neg Hx     Review of Systems: Review of Systems  Constitutional: Negative.   Respiratory: Negative.   Cardiovascular: Negative.   Gastrointestinal: Negative.   Neurological: Negative.     Physical Exam: Vital Signs BP 126/85 (BP Location: Right Arm, Patient Position: Sitting, Cuff Size: Normal)   Pulse 74   Ht 5\' 4"  (1.626 m)   Wt 147 lb (66.7 kg)   SpO2 98%   BMI 25.23 kg/m   Physical Exam Constitutional:      General: Not in acute distress.    Appearance: Normal appearance. Not ill-appearing.  HENT:     Head: Normocephalic and atraumatic.  Eyes:     Pupils: Pupils are equal, round Neck:     Musculoskeletal: Normal range of motion.  Cardiovascular:     Rate and Rhythm: Normal rate    Pulses: Normal pulses.  Pulmonary:     Effort: Pulmonary effort is normal. No respiratory distress.  Abdominal:     General: Abdomen is flat. There is no distension.  Musculoskeletal: Normal range of motion.  Skin:    General: Skin is warm and dry.     Findings: No erythema or rash.  Neurological:     General: No focal deficit  present.     Mental Status: Alert and oriented to person, place, and time. Mental status is at baseline.     Motor: No weakness.  Psychiatric:        Mood and Affect: Mood normal.        Behavior: Behavior normal.    Assessment/Plan: The patient is scheduled for bilateral breast reduction with Dr. Marla Roe.  Risks, benefits, and alternatives of procedure discussed, questions answered and consent obtained.    Smoking Status: Non-smoker; Counseling Given?  N/A  Caprini Score: 6, high; Risk Factors include: Age, history of left breast cancer, on Arimidex and length of planned surgery. Recommendation for mechanical and pharmacological prophylaxis. Encourage early ambulation.   Pictures obtained: 05/27/2020  Post-op Rx sent to pharmacy: Patient declined postop narcotics.  She would prefer to use Tylenol and ibuprofen.  Discussed with her if she would like a prescription for this to call and let our office know.  Norco and Cipro sent to pharmacy.  Patient was provided with the breast reduction and General Surgical Risk consent document and Pain Medication Agreement prior to their appointment.  They had adequate time to read through the risk consent documents and Pain Medication Agreement. We also discussed them in person together during this preop appointment. All of their questions were answered to their satisfaction.  Recommended calling if they have any further questions.  Risk consent form and Pain Medication Agreement to be scanned into patient's chart.  The risk that can be encountered with breast reduction were discussed and include the following but not limited to these:  Breast asymmetry, fluid accumulation, firmness of the breast, inability to breast feed, loss of nipple or areola, skin loss, decrease or no nipple sensation, fat necrosis of the breast tissue, bleeding, infection, healing delay.  There are risks of anesthesia, changes to skin sensation and injury to nerves or blood vessels.   The muscle can be temporarily or permanently injured.  You may have an allergic reaction to tape, suture, glue, blood products which can result in skin discoloration, swelling, pain, skin lesions, poor healing.  Any of these can lead to the need for revisonal surgery or stage procedures.  A reduction has potential to interfere with diagnostic procedures.  Nipple or breast piercing can increase risks of infection.  This procedure is best done when the breast is fully developed.  Changes in the breast will continue to occur over time.  Pregnancy can alter the outcomes of previous breast reduction surgery, weight gain and weigh loss can also effect the long term appearance.   Patient is aware that she is at increased risk of nipple areolar necrosis and postoperative wound complications of the left breast due to the radiation and previous partial mastectomy.  She is comfortable with having the nipple areola graft site on the left side if this is necessary.  We discussed the possibility of amputation/free nipple graft technique due to the history of radiation and partial mastectomy of the left breast.  She is understanding of the possibility that we would need to transition from a pedicle technique to a free nipple graft technique intraoperatively.  We discussed the risks associated with free nipple graft breast reductions, including but not limited to failure of the graft, partial loss of the graft, loss of sensation of bilateral nipple areola, complete loss of the nipple areola graft, inability to breast-feed, postoperative wounds, ongoing wound care.  We also discussed the risks associated with the pedicle technique.  We discussed that with the pedicle technique she could develop nipple areolar necrosis which would result in loss of the nipple, this would also result in ongoing wound care and possible changes in the shape of her breast.    Electronically signed by: Carola Rhine Lyndzee Kliebert, PA-C 08/24/2020 12:43 PM

## 2020-08-24 NOTE — H&P (View-Only) (Signed)
Patient ID: Jeanette Macdonald, female    DOB: 11/26/1970, 50 y.o.   MRN: [id number]  Chief Complaint  Patient presents with  . Pre-op Exam      ICD-10-CM   1. Malignant neoplasm of left female breast, unspecified estrogen receptor status, unspecified site of breast ([place])  C50.912   2. Postoperative breast asymmetry  N64.89     History of Present Illness: Jeanette Macdonald is a 50 y.o.  female  with a history of macromastia.  She presents for preoperative evaluation for upcoming procedure, Bilateral Breast Reduction, scheduled for 09/22/2020 with Dr.  Marla Roe  The patient has not had problems with anesthesia. No history of DVT/PE.  No family history of DVT/PE.  No family or personal history of bleeding or clotting disorders.  Patient is not currently taking any blood thinners.  No history of CVA/MI.   Summary of Previous Visit: Breasts are extremely large and asymmetric due to her left partial mastectomy and radiation.  STN on the right is 35 cm.  STN on the left is 33 cm.  Preoperative bra size equal H cup.  She would like to be a B/C cup.  Estimated excess tissue to be removed at time of surgery equals 450 g on the left and 450 g on the right. She is not a tobacco user.  She does not have a history of diabetes.  She is not on blood thinners.  She had a left partial mastectomy in 2018 followed by radiation.  She reports that she would like to be as small as safely possible.  Preferably a B cup if possible. She reports that she is aware of possible complications related to the history of radiation and swelling of her left breast.  Past Medical History: Allergies: Allergies  Allergen Reactions  . Docetaxel Shortness Of Breath    chest tightness  . Penicillins Hives    Has patient had a PCN reaction causing immediate rash, facial/tongue/throat swelling, SOB or lightheadedness with hypotension: yes Has patient had a PCN reaction causing severe rash involving mucus membranes or skin necrosis:  no Has patient had a PCN reaction that required hospitalization: no Has patient had a PCN reaction occurring within the last 10 years: no If all of the above answers are "NO", then may proceed with Cephalosporin use.   . Vancomycin     Current Medications:  Current Outpatient Medications:  .  ALPRAZolam (XANAX) 0.5 MG tablet, Take 1 tablet (0.5 mg total) by mouth at bedtime as needed for sleep or anxiety., Disp: 20 tablet, Rfl: 0 .  anastrozole (ARIMIDEX) 1 MG tablet, Take 1 mg by mouth daily., Disp: , Rfl:  .  Azelaic Acid 15 % cream, Apply 1 application topically every evening. After skin is thoroughly washed and patted dry, gently but thoroughly massage a thin film of azelaic acid cream into the affected area twice daily, in the morning and evening., Disp: , Rfl:  .  ciprofloxacin (CIPRO) 500 MG tablet, Take 1 tablet (500 mg total) by mouth 2 (two) times daily., Disp: 6 tablet, Rfl: 0 .  ondansetron (ZOFRAN) 4 MG tablet, Take 1 tablet (4 mg total) by mouth every 8 (eight) hours as needed for nausea or vomiting., Disp: 20 tablet, Rfl: 0 .  Vitamin D, Ergocalciferol, (DRISDOL) 1.25 MG (50000 UNIT) CAPS capsule, Take 50,000 Units by mouth every 7 (seven) days., Disp: , Rfl:   Past Medical Problems: Past Medical History:  Diagnosis Date  . Abscess  perianal  . Anxiety    Flying  . History of hepatitis B    20+ years ago  . Malignant neoplasm of lower-inner quadrant of left female breast ([place]) 12/29/2015    Past Surgical History: Past Surgical History:  Procedure Laterality Date  . CESAREAN SECTION  02/02/08    Social History: Social History   Socioeconomic History  . Marital status: Married    Spouse name: Not on file  . Number of children: 1  . Years of education: Not on file  . Highest education level: Not on file  Occupational History  . Occupation: Scientist, research (medical): [employer]  Tobacco Use  . Smoking status: Never Smoker  . Smokeless tobacco: Never Used   Substance and Sexual Activity  . Alcohol use: No    Comment: occasionally   . Drug use: No  . Sexual activity: Yes    Birth control/protection: Surgical    Comment: husband vasectomy   Other Topics Concern  . Not on file  Social History Narrative  . Not on file   Social Determinants of Health   Financial Resource Strain: Not on file  Food Insecurity: Not on file  Transportation Needs: Not on file  Physical Activity: Not on file  Stress: Not on file  Social Connections: Not on file  Intimate Partner Violence: Not on file    Family History: Family History  Problem Relation Age of Onset  . Cancer Maternal Grandmother        stomach  . Cancer Paternal Grandmother        lung cancer   . Cancer Maternal Aunt        liver cancer  . Cancer Maternal Grandfather        lung cancer  . Cancer Paternal Grandfather        lung cancer  . CAD Neg Hx     Review of Systems: Review of Systems  Constitutional: Negative.   Respiratory: Negative.   Cardiovascular: Negative.   Gastrointestinal: Negative.   Neurological: Negative.     Physical Exam: Vital Signs BP 126/85 (BP Location: Right Arm, Patient Position: Sitting, Cuff Size: Normal)   Pulse 74   Ht 5\' 4"  (1.626 m)   Wt 147 lb (66.7 kg)   SpO2 98%   BMI 25.23 kg/m   Physical Exam Constitutional:      General: Not in acute distress.    Appearance: Normal appearance. Not ill-appearing.  HENT:     Head: Normocephalic and atraumatic.  Eyes:     Pupils: Pupils are equal, round Neck:     Musculoskeletal: Normal range of motion.  Cardiovascular:     Rate and Rhythm: Normal rate    Pulses: Normal pulses.  Pulmonary:     Effort: Pulmonary effort is normal. No respiratory distress.  Abdominal:     General: Abdomen is flat. There is no distension.  Musculoskeletal: Normal range of motion.  Skin:    General: Skin is warm and dry.     Findings: No erythema or rash.  Neurological:     General: No focal deficit  present.     Mental Status: Alert and oriented to person, place, and time. Mental status is at baseline.     Motor: No weakness.  Psychiatric:        Mood and Affect: Mood normal.        Behavior: Behavior normal.    Assessment/Plan: The patient is scheduled for bilateral breast reduction with Dr. Marla Roe.  Risks, benefits, and alternatives of procedure discussed, questions answered and consent obtained.    Smoking Status: Non-smoker; Counseling Given?  N/A  Caprini Score: 6, high; Risk Factors include: Age, history of left breast cancer, on Arimidex and length of planned surgery. Recommendation for mechanical and pharmacological prophylaxis. Encourage early ambulation.   Pictures obtained: 05/27/2020  Post-op Rx sent to pharmacy: Patient declined postop narcotics.  She would prefer to use Tylenol and ibuprofen.  Discussed with her if she would like a prescription for this to call and let our office know.  Norco and Cipro sent to pharmacy.  Patient was provided with the breast reduction and General Surgical Risk consent document and Pain Medication Agreement prior to their appointment.  They had adequate time to read through the risk consent documents and Pain Medication Agreement. We also discussed them in person together during this preop appointment. All of their questions were answered to their satisfaction.  Recommended calling if they have any further questions.  Risk consent form and Pain Medication Agreement to be scanned into patient's chart.  The risk that can be encountered with breast reduction were discussed and include the following but not limited to these:  Breast asymmetry, fluid accumulation, firmness of the breast, inability to breast feed, loss of nipple or areola, skin loss, decrease or no nipple sensation, fat necrosis of the breast tissue, bleeding, infection, healing delay.  There are risks of anesthesia, changes to skin sensation and injury to nerves or blood vessels.   The muscle can be temporarily or permanently injured.  You may have an allergic reaction to tape, suture, glue, blood products which can result in skin discoloration, swelling, pain, skin lesions, poor healing.  Any of these can lead to the need for revisonal surgery or stage procedures.  A reduction has potential to interfere with diagnostic procedures.  Nipple or breast piercing can increase risks of infection.  This procedure is best done when the breast is fully developed.  Changes in the breast will continue to occur over time.  Pregnancy can alter the outcomes of previous breast reduction surgery, weight gain and weigh loss can also effect the long term appearance.   Patient is aware that she is at increased risk of nipple areolar necrosis and postoperative wound complications of the left breast due to the radiation and previous partial mastectomy.  She is comfortable with having the nipple areola graft site on the left side if this is necessary.  We discussed the possibility of amputation/free nipple graft technique due to the history of radiation and partial mastectomy of the left breast.  She is understanding of the possibility that we would need to transition from a pedicle technique to a free nipple graft technique intraoperatively.  We discussed the risks associated with free nipple graft breast reductions, including but not limited to failure of the graft, partial loss of the graft, loss of sensation of bilateral nipple areola, complete loss of the nipple areola graft, inability to breast-feed, postoperative wounds, ongoing wound care.  We also discussed the risks associated with the pedicle technique.  We discussed that with the pedicle technique she could develop nipple areolar necrosis which would result in loss of the nipple, this would also result in ongoing wound care and possible changes in the shape of her breast.    Electronically signed by: Carola Rhine Christhoper Busbee, PA-C 08/24/2020 12:43 PM

## 2020-08-26 ENCOUNTER — Encounter: Payer: BC Managed Care – PPO | Admitting: Surgical

## 2020-09-07 ENCOUNTER — Encounter (HOSPITAL_BASED_OUTPATIENT_CLINIC_OR_DEPARTMENT_OTHER): Payer: Self-pay | Admitting: Plastic Surgery

## 2020-09-07 ENCOUNTER — Other Ambulatory Visit: Payer: Self-pay

## 2020-09-20 ENCOUNTER — Other Ambulatory Visit (HOSPITAL_COMMUNITY): Payer: BC Managed Care – PPO

## 2020-09-21 ENCOUNTER — Encounter (HOSPITAL_BASED_OUTPATIENT_CLINIC_OR_DEPARTMENT_OTHER): Payer: Self-pay | Admitting: Plastic Surgery

## 2020-09-21 NOTE — Anesthesia Preprocedure Evaluation (Addendum)
Anesthesia Evaluation  Patient identified by MRN, date of birth, ID band Patient awake    Reviewed: Allergy & Precautions, NPO status , Patient's Chart, lab work & pertinent test results  Airway Mallampati: II  TM Distance: >3 FB Neck ROM: Full    Dental no notable dental hx. (+) Teeth Intact, Dental Advisory Given   Pulmonary neg pulmonary ROS,    Pulmonary exam normal breath sounds clear to auscultation       Cardiovascular negative cardio ROS Normal cardiovascular exam Rhythm:Regular Rate:Normal     Neuro/Psych Anxiety negative neurological ROS     GI/Hepatic negative GI ROS, Neg liver ROS,   Endo/Other  Mammary hypertrophy Hx/o left breast Ca S/P lumpectomy ChemoRx   Renal/GU Renal InsufficiencyRenal disease  negative genitourinary   Musculoskeletal negative musculoskeletal ROS (+)   Abdominal   Peds  Hematology  (+) anemia ,   Anesthesia Other Findings   Reproductive/Obstetrics                           Anesthesia Physical Anesthesia Plan  ASA: 2  Anesthesia Plan: General   Post-op Pain Management:    Induction: Intravenous  PONV Risk Score and Plan: 4 or greater and Treatment may vary due to age or medical condition, Scopolamine patch - Pre-op, Midazolam, Dexamethasone and Ondansetron  Airway Management Planned: Oral ETT  Additional Equipment:   Intra-op Plan:   Post-operative Plan: Extubation in OR  Informed Consent: I have reviewed the patients History and Physical, chart, labs and discussed the procedure including the risks, benefits and alternatives for the proposed anesthesia with the patient or authorized representative who has indicated his/her understanding and acceptance.     Dental advisory given  Plan Discussed with: CRNA and Anesthesiologist  Anesthesia Plan Comments:        Anesthesia Quick Evaluation

## 2020-09-22 ENCOUNTER — Other Ambulatory Visit: Payer: Self-pay

## 2020-09-22 ENCOUNTER — Encounter (HOSPITAL_BASED_OUTPATIENT_CLINIC_OR_DEPARTMENT_OTHER)
Admission: RE | Disposition: A | Discharge status: Home / Self Care | Payer: Self-pay | Source: Home / Self Care | Attending: Plastic Surgery

## 2020-09-22 ENCOUNTER — Encounter (HOSPITAL_BASED_OUTPATIENT_CLINIC_OR_DEPARTMENT_OTHER): Payer: Self-pay | Admitting: Plastic Surgery

## 2020-09-22 ENCOUNTER — Ambulatory Visit (HOSPITAL_BASED_OUTPATIENT_CLINIC_OR_DEPARTMENT_OTHER): Payer: BC Managed Care – PPO | Admitting: Anesthesiology

## 2020-09-22 ENCOUNTER — Ambulatory Visit (HOSPITAL_BASED_OUTPATIENT_CLINIC_OR_DEPARTMENT_OTHER)
Admission: RE | Admit: 2020-09-22 | Discharge: 2020-09-22 | Disposition: A | Discharge status: Home / Self Care | Payer: BC Managed Care – PPO | Attending: Plastic Surgery | Admitting: Plastic Surgery

## 2020-09-22 ENCOUNTER — Telehealth: Payer: Self-pay | Admitting: Plastic Surgery

## 2020-09-22 DIAGNOSIS — M542 Cervicalgia: Secondary | ICD-10-CM | POA: Diagnosis not present

## 2020-09-22 DIAGNOSIS — Z881 Allergy status to other antibiotic agents status: Secondary | ICD-10-CM | POA: Diagnosis not present

## 2020-09-22 DIAGNOSIS — Z800 Family history of malignant neoplasm of digestive organs: Secondary | ICD-10-CM | POA: Diagnosis not present

## 2020-09-22 DIAGNOSIS — Z923 Personal history of irradiation: Secondary | ICD-10-CM | POA: Insufficient documentation

## 2020-09-22 DIAGNOSIS — Z801 Family history of malignant neoplasm of trachea, bronchus and lung: Secondary | ICD-10-CM | POA: Diagnosis not present

## 2020-09-22 DIAGNOSIS — Z853 Personal history of malignant neoplasm of breast: Secondary | ICD-10-CM | POA: Insufficient documentation

## 2020-09-22 DIAGNOSIS — Z888 Allergy status to other drugs, medicaments and biological substances status: Secondary | ICD-10-CM | POA: Diagnosis not present

## 2020-09-22 DIAGNOSIS — N62 Hypertrophy of breast: Secondary | ICD-10-CM | POA: Insufficient documentation

## 2020-09-22 DIAGNOSIS — Z880 Allergy status to penicillin: Secondary | ICD-10-CM | POA: Diagnosis not present

## 2020-09-22 DIAGNOSIS — M549 Dorsalgia, unspecified: Secondary | ICD-10-CM | POA: Insufficient documentation

## 2020-09-22 DIAGNOSIS — C50912 Malignant neoplasm of unspecified site of left female breast: Secondary | ICD-10-CM

## 2020-09-22 DIAGNOSIS — Z8249 Family history of ischemic heart disease and other diseases of the circulatory system: Secondary | ICD-10-CM | POA: Insufficient documentation

## 2020-09-22 DIAGNOSIS — N6489 Other specified disorders of breast: Secondary | ICD-10-CM

## 2020-09-22 HISTORY — PX: BREAST REDUCTION SURGERY: SHX8

## 2020-09-22 SURGERY — BREAST REDUCTION WITH LIPOSUCTION
Anesthesia: General | Site: Breast | Laterality: Bilateral

## 2020-09-22 MED ORDER — LIDOCAINE-EPINEPHRINE 1 %-1:100000 IJ SOLN
INTRAMUSCULAR | Status: AC
  Filled 2020-09-22: qty 1

## 2020-09-22 MED ORDER — PHENYLEPHRINE 40 MCG/ML (10ML) SYRINGE FOR IV PUSH (FOR BLOOD PRESSURE SUPPORT)
PREFILLED_SYRINGE | INTRAVENOUS | Status: AC
  Filled 2020-09-22: qty 10

## 2020-09-22 MED ORDER — CEFAZOLIN SODIUM-DEXTROSE 2-4 GM/100ML-% IV SOLN
INTRAVENOUS | Status: AC
  Filled 2020-09-22: qty 100

## 2020-09-22 MED ORDER — PROPOFOL 10 MG/ML IV BOLUS
INTRAVENOUS | Status: DC | PRN
  Administered 2020-09-22: 200 mg via INTRAVENOUS

## 2020-09-22 MED ORDER — BACITRACIN ZINC 500 UNIT/GM EX OINT
TOPICAL_OINTMENT | CUTANEOUS | Status: AC
  Filled 2020-09-22: qty 28.35

## 2020-09-22 MED ORDER — DROPERIDOL 2.5 MG/ML IJ SOLN
INTRAMUSCULAR | Status: DC | PRN
  Administered 2020-09-22: .625 mg via INTRAVENOUS

## 2020-09-22 MED ORDER — SUGAMMADEX SODIUM 500 MG/5ML IV SOLN
INTRAVENOUS | Status: AC
  Filled 2020-09-22: qty 5

## 2020-09-22 MED ORDER — BUPIVACAINE-EPINEPHRINE (PF) 0.25% -1:200000 IJ SOLN
INTRAMUSCULAR | Status: AC
  Filled 2020-09-22: qty 30

## 2020-09-22 MED ORDER — EPHEDRINE SULFATE 50 MG/ML IJ SOLN
INTRAMUSCULAR | Status: DC | PRN
  Administered 2020-09-22: 5 mg via INTRAVENOUS
  Administered 2020-09-22: 10 mg via INTRAVENOUS

## 2020-09-22 MED ORDER — CLINDAMYCIN PHOSPHATE 900 MG/50ML IV SOLN
INTRAVENOUS | Status: AC
  Filled 2020-09-22: qty 50

## 2020-09-22 MED ORDER — SCOPOLAMINE 1 MG/3DAYS TD PT72
1.0000 | MEDICATED_PATCH | TRANSDERMAL | Status: DC
  Administered 2020-09-22: 1.5 mg via TRANSDERMAL

## 2020-09-22 MED ORDER — SCOPOLAMINE 1 MG/3DAYS TD PT72
MEDICATED_PATCH | TRANSDERMAL | Status: AC
  Filled 2020-09-22: qty 1

## 2020-09-22 MED ORDER — DEXAMETHASONE SODIUM PHOSPHATE 10 MG/ML IJ SOLN
INTRAMUSCULAR | Status: AC
  Filled 2020-09-22: qty 1

## 2020-09-22 MED ORDER — SUFENTANIL CITRATE 50 MCG/ML IV SOLN
INTRAVENOUS | Status: DC | PRN
  Administered 2020-09-22 (×2): 5 ug via INTRAVENOUS
  Administered 2020-09-22: 10 ug via INTRAVENOUS

## 2020-09-22 MED ORDER — LIDOCAINE-EPINEPHRINE 1 %-1:100000 IJ SOLN
INTRAMUSCULAR | Status: DC | PRN
  Administered 2020-09-22: 50 mL

## 2020-09-22 MED ORDER — ROCURONIUM BROMIDE 100 MG/10ML IV SOLN
INTRAVENOUS | Status: DC | PRN
  Administered 2020-09-22: 70 mg via INTRAVENOUS

## 2020-09-22 MED ORDER — ONDANSETRON HCL 4 MG/2ML IJ SOLN
INTRAMUSCULAR | Status: AC
  Filled 2020-09-22: qty 2

## 2020-09-22 MED ORDER — CEFAZOLIN SODIUM-DEXTROSE 2-4 GM/100ML-% IV SOLN: 2.0000 g | INTRAVENOUS | Status: DC

## 2020-09-22 MED ORDER — EPHEDRINE 5 MG/ML INJ
INTRAVENOUS | Status: AC
  Filled 2020-09-22: qty 10

## 2020-09-22 MED ORDER — CLINDAMYCIN PHOSPHATE 900 MG/50ML IV SOLN
INTRAVENOUS | Status: DC | PRN
  Administered 2020-09-22: 900 mg via INTRAVENOUS

## 2020-09-22 MED ORDER — EPINEPHRINE PF 1 MG/ML IJ SOLN
INTRAMUSCULAR | Status: AC
  Filled 2020-09-22: qty 1

## 2020-09-22 MED ORDER — BUPIVACAINE HCL (PF) 0.25 % IJ SOLN
INTRAMUSCULAR | Status: AC
  Filled 2020-09-22: qty 30

## 2020-09-22 MED ORDER — ONDANSETRON HCL 4 MG/2ML IJ SOLN
INTRAMUSCULAR | Status: DC | PRN
  Administered 2020-09-22: 4 mg via INTRAVENOUS

## 2020-09-22 MED ORDER — SUFENTANIL CITRATE 50 MCG/ML IV SOLN
INTRAVENOUS | Status: AC
  Filled 2020-09-22: qty 1

## 2020-09-22 MED ORDER — SUGAMMADEX SODIUM 200 MG/2ML IV SOLN
INTRAVENOUS | Status: DC | PRN
  Administered 2020-09-22: 134 mg via INTRAVENOUS

## 2020-09-22 MED ORDER — CHLORHEXIDINE GLUCONATE CLOTH 2 % EX PADS: 6.0000 | MEDICATED_PAD | Freq: Once | CUTANEOUS | Status: DC

## 2020-09-22 MED ORDER — LIDOCAINE HCL (CARDIAC) PF 100 MG/5ML IV SOSY
PREFILLED_SYRINGE | INTRAVENOUS | Status: DC | PRN
  Administered 2020-09-22: 50 mg via INTRAVENOUS

## 2020-09-22 MED ORDER — NITROGLYCERIN 2 % TD OINT
TOPICAL_OINTMENT | TRANSDERMAL | Status: AC
  Filled 2020-09-22: qty 30

## 2020-09-22 MED ORDER — ROCURONIUM BROMIDE 10 MG/ML (PF) SYRINGE
PREFILLED_SYRINGE | INTRAVENOUS | Status: AC
  Filled 2020-09-22: qty 10

## 2020-09-22 MED ORDER — MIDAZOLAM HCL 5 MG/5ML IJ SOLN
INTRAMUSCULAR | Status: DC | PRN
  Administered 2020-09-22: 2 mg via INTRAVENOUS

## 2020-09-22 MED ORDER — NITROGLYCERIN 2 % TD OINT
TOPICAL_OINTMENT | TRANSDERMAL | Status: DC | PRN
  Administered 2020-09-22 (×2): 0.5 [in_us] via TOPICAL

## 2020-09-22 MED ORDER — DEXAMETHASONE SODIUM PHOSPHATE 4 MG/ML IJ SOLN
INTRAMUSCULAR | Status: DC | PRN
  Administered 2020-09-22: 10 mg via INTRAVENOUS

## 2020-09-22 MED ORDER — LACTATED RINGERS IV SOLN: INTRAVENOUS | Status: DC

## 2020-09-22 MED ORDER — MIDAZOLAM HCL 2 MG/2ML IJ SOLN
INTRAMUSCULAR | Status: AC
  Filled 2020-09-22: qty 2

## 2020-09-22 MED ORDER — LIDOCAINE HCL (PF) 2 % IJ SOLN
INTRAMUSCULAR | Status: AC
  Filled 2020-09-22: qty 5

## 2020-09-22 MED ORDER — SUCCINYLCHOLINE CHLORIDE 200 MG/10ML IV SOSY
PREFILLED_SYRINGE | INTRAVENOUS | Status: AC
  Filled 2020-09-22: qty 10

## 2020-09-22 MED ORDER — LIDOCAINE HCL (PF) 1 % IJ SOLN
INTRAMUSCULAR | Status: AC
  Filled 2020-09-22: qty 30

## 2020-09-22 MED ORDER — LIDOCAINE HCL 1 % IJ SOLN
INTRAVENOUS | Status: DC | PRN
  Administered 2020-09-22: 1000 mL

## 2020-09-22 SURGICAL SUPPLY — 62 items
ADH SKN CLS APL DERMABOND .7 (GAUZE/BANDAGES/DRESSINGS) ×3
BAG DECANTER FOR FLEXI CONT (MISCELLANEOUS) IMPLANT
BINDER BREAST LRG (GAUZE/BANDAGES/DRESSINGS) IMPLANT
BINDER BREAST MEDIUM (GAUZE/BANDAGES/DRESSINGS) IMPLANT
BINDER BREAST XLRG (GAUZE/BANDAGES/DRESSINGS) ×2 IMPLANT
BINDER BREAST XXLRG (GAUZE/BANDAGES/DRESSINGS) IMPLANT
BIOPATCH RED 1 DISK 7.0 (GAUZE/BANDAGES/DRESSINGS) IMPLANT
BLADE HEX COATED 2.75 (ELECTRODE) IMPLANT
BLADE KNIFE PERSONA 10 (BLADE) ×4 IMPLANT
BLADE SURG 15 STRL LF DISP TIS (BLADE) ×1 IMPLANT
BLADE SURG 15 STRL SS (BLADE) ×2
CANISTER SUCT 1200ML W/VALVE (MISCELLANEOUS) ×2 IMPLANT
COVER BACK TABLE 60X90IN (DRAPES) ×2 IMPLANT
COVER MAYO STAND STRL (DRAPES) ×2 IMPLANT
DECANTER SPIKE VIAL GLASS SM (MISCELLANEOUS) IMPLANT
DERMABOND ADVANCED (GAUZE/BANDAGES/DRESSINGS) ×3
DERMABOND ADVANCED .7 DNX12 (GAUZE/BANDAGES/DRESSINGS) ×3 IMPLANT
DRAIN CHANNEL 19F RND (DRAIN) IMPLANT
DRAPE LAPAROSCOPIC ABDOMINAL (DRAPES) ×2 IMPLANT
DRSG OPSITE POSTOP 4X12 (GAUZE/BANDAGES/DRESSINGS) ×4 IMPLANT
DRSG OPSITE POSTOP 4X6 (GAUZE/BANDAGES/DRESSINGS) ×2 IMPLANT
DRSG PAD ABDOMINAL 8X10 ST (GAUZE/BANDAGES/DRESSINGS) ×6 IMPLANT
ELECT BLADE 4.0 EZ CLEAN MEGAD (MISCELLANEOUS) ×2
ELECT REM PT RETURN 9FT ADLT (ELECTROSURGICAL) ×2
ELECTRODE BLDE 4.0 EZ CLN MEGD (MISCELLANEOUS) ×1 IMPLANT
ELECTRODE REM PT RTRN 9FT ADLT (ELECTROSURGICAL) ×1 IMPLANT
EVACUATOR SILICONE 100CC (DRAIN) IMPLANT
GLOVE SURG ENC MOIS LTX SZ6.5 (GLOVE) ×6 IMPLANT
GLOVE SURG ENC MOIS LTX SZ7.5 (GLOVE) ×4 IMPLANT
GOWN STRL REUS W/ TWL LRG LVL3 (GOWN DISPOSABLE) ×4 IMPLANT
GOWN STRL REUS W/TWL LRG LVL3 (GOWN DISPOSABLE) ×8
NEEDLE FILTER BLUNT 18X 1/2SAF (NEEDLE) ×1
NEEDLE FILTER BLUNT 18X1 1/2 (NEEDLE) ×1 IMPLANT
NEEDLE HYPO 25X1 1.5 SAFETY (NEEDLE) ×2 IMPLANT
NS IRRIG 1000ML POUR BTL (IV SOLUTION) ×2 IMPLANT
PACK BASIN DAY SURGERY FS (CUSTOM PROCEDURE TRAY) ×2 IMPLANT
PAD ALCOHOL SWAB (MISCELLANEOUS) ×2 IMPLANT
PAD FOAM SILICONE BACKED (GAUZE/BANDAGES/DRESSINGS) ×2 IMPLANT
PENCIL SMOKE EVACUATOR (MISCELLANEOUS) ×2 IMPLANT
PIN SAFETY STERILE (MISCELLANEOUS) IMPLANT
SLEEVE SCD COMPRESS KNEE MED (STOCKING) ×2 IMPLANT
SPONGE T-LAP 18X18 ~~LOC~~+RFID (SPONGE) ×6 IMPLANT
STRIP SUTURE WOUND CLOSURE 1/2 (MISCELLANEOUS) ×6 IMPLANT
SUT MNCRL AB 4-0 PS2 18 (SUTURE) ×20 IMPLANT
SUT MON AB 3-0 SH 27 (SUTURE) ×12
SUT MON AB 3-0 SH27 (SUTURE) ×6 IMPLANT
SUT MON AB 5-0 PS2 18 (SUTURE) ×4 IMPLANT
SUT PDS 3-0 CT2 (SUTURE) ×6
SUT PDS AB 2-0 CT2 27 (SUTURE) ×4 IMPLANT
SUT PDS II 3-0 CT2 27 ABS (SUTURE) ×3 IMPLANT
SUT SILK 3 0 PS 1 (SUTURE) ×2 IMPLANT
SYR 50ML LL SCALE MARK (SYRINGE) IMPLANT
SYR BULB IRRIG 60ML STRL (SYRINGE) IMPLANT
SYR CONTROL 10ML LL (SYRINGE) ×2 IMPLANT
TAPE MEASURE VINYL STERILE (MISCELLANEOUS) IMPLANT
TOWEL GREEN STERILE FF (TOWEL DISPOSABLE) ×4 IMPLANT
TRAY DSU PREP LF (CUSTOM PROCEDURE TRAY) ×2 IMPLANT
TUBE CONNECTING 20X1/4 (TUBING) ×2 IMPLANT
TUBING INFILTRATION IT-10001 (TUBING) ×2 IMPLANT
TUBING SET GRADUATE ASPIR 12FT (MISCELLANEOUS) IMPLANT
UNDERPAD 30X36 HEAVY ABSORB (UNDERPADS AND DIAPERS) ×4 IMPLANT
YANKAUER SUCT BULB TIP NO VENT (SUCTIONS) ×2 IMPLANT

## 2020-09-22 NOTE — Transfer of Care (Signed)
Immediate Anesthesia Transfer of Care Note  Patient: Jeanette Macdonald  Procedure(s) Performed: BREAST REDUCTION WITH LIPOSUCTION (Bilateral: Breast)  Patient Location: PACU  Anesthesia Type:General  Level of Consciousness: awake, alert , oriented, drowsy and patient cooperative  Airway & Oxygen Therapy: Patient Spontanous Breathing and Patient connected to face mask oxygen  Post-op Assessment: Report given to RN and Post -op Vital signs reviewed and stable  Post vital signs: Reviewed and stable  Last Vitals:  Vitals Value Taken Time  BP    Temp    Pulse 92 09/22/20 0958  Resp 16 09/22/20 0958  SpO2 99 % 09/22/20 0958  Vitals shown include unvalidated device data.  Last Pain:  Vitals:   09/22/20 0625  TempSrc: Oral  PainSc: 0-No pain      Patients Stated Pain Goal: 6 (46/65/99 3570)  Complications: No notable events documented.

## 2020-09-22 NOTE — Anesthesia Procedure Notes (Signed)

## 2020-09-22 NOTE — Discharge Instructions (Addendum)
INSTRUCTIONS FOR AFTER SURGERY   You will likely have some questions about what to expect following your operation.  The following information will help you and your family understand what to expect when you are discharged from the hospital.  Following these guidelines will help ensure a smooth recovery and reduce risks of complications.  Postoperative instructions include information on: diet, wound care, medications and physical activity.  AFTER SURGERY Expect to go home after the procedure.  In some cases, you may need to spend one night in the hospital for observation.  DIET This surgery does not require a specific diet.  However, I have to mention that the healthier you eat the better your body can start healing. It is important to increasing your protein intake.  This means limiting the foods with added sugar.  Focus on fruits and vegetables and some meat. It is very important to drink water after your surgery.  If your urine is bright yellow, then it is concentrated, and you need to drink more water.  As a general rule after surgery, you should have 8 ounces of water every hour while awake.  If you find you are persistently nauseated or unable to take in liquids let us know.  NO TOBACCO USE or EXPOSURE.  This will slow your healing process and increase the risk of a wound.  WOUND CARE If you don't have a drain: You can shower the day after surgery.  Use fragrance free soap.  Dial, [place], [place] and Cetaphil are usually mild on the skin.  If you have steri-strips / tape directly attached to your skin leave them in place. It is OK to get these wet.  No baths, pools or hot tubs for two weeks. We close your incision to leave the smallest and best-looking scar. No ointment or creams on your incisions until given the go ahead.  Especially not Neosporin (Too many skin reactions with this one).  A few weeks after surgery you can use Mederma and start massaging the scar. We ask you to wear your binder or  sports bra for the first 6 weeks around the clock, including while sleeping. This provides added comfort and helps reduce the fluid accumulation at the surgery site.  ACTIVITY No heavy lifting until cleared by the doctor.  It is OK to walk and climb stairs. In fact, moving your legs is very important to decrease your risk of a blood clot.  It will also help keep you from getting deconditioned.  Every 1 to 2 hours get up and walk for 5 minutes. This will help with a quicker recovery back to normal.  Let pain be your guide so you don't do too much.  NO, you cannot do the spring cleaning and don't plan on taking care of anyone else.  This is your time for TLC.   WORK Everyone returns to work at different times. As a rough guide, most people take at least 1 - 2 weeks off prior to returning to work. If you need documentation for your job, bring the forms to your postoperative follow up visit.  DRIVING Arrange for someone to bring you home from the hospital.  You may be able to drive a few days after surgery but not while taking any narcotics or valium.  BOWEL MOVEMENTS Constipation can occur after anesthesia and while taking pain medication.  It is important to stay ahead for your comfort.  We recommend taking Milk of Magnesia (2 tablespoons; twice a day) while taking  the pain pills.  SEROMA This is fluid your body tried to put in the surgical site.  This is normal but if it creates excessive pain and swelling let us know.  It usually decreases in a few weeks.  MEDICATIONS and PAIN CONTROL At your preoperative visit for you history and physical you were given the following medications: An antibiotic: Start this medication when you get home and take according to the instructions on the bottle. Zofran 4 mg:  This is to treat nausea and vomiting.  You can take this every 6 hours as needed and only if needed. Norco (hydrocodone/acetaminophen) 5/325 mg:  This is only to be used after you have taken the  motrin or the tylenol. Every 8 hours as needed. Over the counter Medication to take: Ibuprofen (Motrin) 600 mg:  Take this every 6 hours.  If you have additional pain then take 500 mg of the tylenol.  Only take the Norco after you have tried these two. Miralax or stool softener of choice: Take this according to the bottle if you take the Norco. 6.   Nitro paste 0.5 inch to each areola.  WHEN TO CALL Call your surgeon's office if any of the following occur:  Fever 101 degrees F or greater  Excessive bleeding or fluid from the incision site.  Pain that increases over time without aid from the medications  Redness, warmth, or pus draining from incision sites  Persistent nausea or inability to take in liquids  Severe misshapen area that underwent the operation.    Post Anesthesia Home Care Instructions  Activity: Get plenty of rest for the remainder of the day. A responsible individual must stay with you for 24 hours following the procedure.  For the next 24 hours, DO NOT: -Drive a car -Paediatric nurse -Drink alcoholic beverages -Take any medication unless instructed by your physician -Make any legal decisions or sign important papers.  Meals: Start with liquid foods such as gelatin or soup. Progress to regular foods as tolerated. Avoid greasy, spicy, heavy foods. If nausea and/or vomiting occur, drink only clear liquids until the nausea and/or vomiting subsides. Call your physician if vomiting continues.  Special Instructions/Symptoms: Your throat may feel dry or sore from the anesthesia or the breathing tube placed in your throat during surgery. If this causes discomfort, gargle with warm salt water. The discomfort should disappear within 24 hours.  If you had a scopolamine patch placed behind your ear for the management of post- operative nausea and/or vomiting:  1. The medication in the patch is effective for 72 hours, after which it should be removed.  Wrap patch in a tissue  and discard in the trash. Wash hands thoroughly with soap and water. 2. You may remove the patch earlier than 72 hours if you experience unpleasant side effects which may include dry mouth, dizziness or visual disturbances. 3. Avoid touching the patch. Wash your hands with soap and water after contact with the patch.

## 2020-09-22 NOTE — Telephone Encounter (Signed)
Notified Dr. Marla Roe to call husband.

## 2020-09-22 NOTE — Op Note (Signed)
Breast Reduction Op note:    DATE OF PROCEDURE: 09/22/2020  LOCATION: [facility]  SURGEON: Lyndee Leo Sanger Faaris Arizpe, DO  ASSISTANT: Roetta Sessions, PA  PREOPERATIVE DIAGNOSIS 1. Macromastia 2. Neck Pain 3. Back Pain  POSTOPERATIVE DIAGNOSIS 1. Macromastia 2. Neck Pain 3. Back Pain  PROCEDURES 1. Bilateral breast reduction.  Right reduction 530 g, Left reduction 846 g  COMPLICATIONS: None.  DRAINS: none  INDICATIONS FOR PROCEDURE Jeanette Macdonald is a 50 y.o. year-old female born on [birth year],with a history of symptomatic macromastia with concominant back pain, neck pain, shoulder grooving from her bra.   MRN: [id number]  CONSENT Informed consent was obtained directly from the patient. The risks, benefits and alternatives were fully discussed. Specific risks including but not limited to bleeding, infection, hematoma, seroma, scarring, pain, nipple necrosis, asymmetry, poor cosmetic results, and need for further surgery were discussed. The patient had ample opportunity to have her questions answered to her satisfaction.  Due to the history of left breast cancer surgery and radiation it was made clear to the patient that the risk of areola loss was much higher in this case.  Patient acknowledged understanding and being ok with no areola.  DESCRIPTION OF PROCEDURE  Patient was brought into the operating room and placed in a supine position.  SCDs were placed and appropriate padding was performed.  Antibiotics were given. The patient underwent general anesthesia and the chest was prepped and draped in a sterile fashion.  A timeout was performed and all information was confirmed to be correct. Tumescent was placed in the lateral aspect of both breasts.  Right side: Preoperative markings were confirmed.  Incision lines were injected with local with epinephrine.  After waiting for vasoconstriction, the marked lines were incised.  A Wise-pattern superomedial breast  reduction was performed by de-epithelializing the pedicle, using bovie to create the superomedial pedicle, and removing breast tissue from the lateral and inferior portions of the breast.  Care was taken to not undermine the breast pedicle. Hemostasis was achieved.  The nipple was gently rotated into position and the soft tissue closed with 4-0 Monocryl.   Liposuction was done laterally. The pocket was irrigated and hemostasis confirmed.  The deep tissues were approximated with 3-0 PDS and Monocryl sutures and the skin was closed with deep dermal and subcuticular 4-0 Monocryl sutures.  The nipple and skin flaps had good capillary refill at the end of the procedure.    Left side: Preoperative markings were confirmed.  Incision lines were injected with local with epinephrine.  After waiting for vasoconstriction, the marked lines were incised.  A Wise-pattern superomedial breast reduction was performed by de-epithelializing the pedicle, using bovie to create the superomedial pedicle, and removing breast tissue from the lateral and inferior portions of the breast.  Care was taken to not undermine the breast pedicle. Hemostasis was achieved.  The nipple was gently rotated into position and the soft tissue was closed with 4-0 Monocryl.  Liposuction was done laterally. The patient was sat upright and size and shape symmetry was confirmed.  The pocket was irrigated and hemostasis confirmed.  The deep tissues were approximated with 3-0 PDS and Monocryl sutures and the skin was closed with deep dermal and subcuticular 4-0 Monocryl sutures.  Dermabond was applied.  A breast binder and ABDs were placed.  The nipple and skin flaps had good capillary refill at the end of the procedure.  The patient tolerated the procedure well. The patient was allowed to wake from anesthesia  and taken to the recovery room in satisfactory condition.  The advanced practice practitioner (APP) assisted throughout the case.  The APP was essential  in retraction and counter traction when needed to make the case progress smoothly.  This retraction and assistance made it possible to see the tissue plans for the procedure.  The assistance was needed for blood control, tissue re-approximation and assisted with closure of the incision site.

## 2020-09-22 NOTE — Anesthesia Postprocedure Evaluation (Signed)
Anesthesia Post Note  Patient: Adrieana Fennelly  Procedure(s) Performed: BREAST REDUCTION WITH LIPOSUCTION (Bilateral: Breast)     Patient location during evaluation: PACU Anesthesia Type: General Level of consciousness: awake and alert and oriented Pain management: pain level controlled Vital Signs Assessment: post-procedure vital signs reviewed and stable Respiratory status: spontaneous breathing, nonlabored ventilation and respiratory function stable Cardiovascular status: blood pressure returned to baseline and stable Postop Assessment: no apparent nausea or vomiting Anesthetic complications: no   No notable events documented.  Last Vitals:  Vitals:   09/22/20 1015 09/22/20 1030  BP: 109/64 107/63  Pulse: 86 83  Resp: 15 14  Temp:    SpO2: 96% 96%    Last Pain:  Vitals:   09/22/20 1015  TempSrc:   PainSc: 3                  Carleen Rhue A.

## 2020-09-22 NOTE — Interval H&P Note (Signed)
History and Physical Interval Note:  09/22/2020 6:57 AM  Jeanette Macdonald  has presented today for surgery, with the diagnosis of mammary hypertrophy.  The various methods of treatment have been discussed with the patient and family. After consideration of risks, benefits and other options for treatment, the patient has consented to  Procedure(s) with comments: BREAST REDUCTION WITH LIPOSUCTION (Bilateral) - 3 hours as a surgical intervention.  The patient's history has been reviewed, patient examined, no change in status, stable for surgery.  I have reviewed the patient's chart and labs.  Questions were answered to the patient's satisfaction.     Loel Lofty Criselda Starke

## 2020-09-22 NOTE — Telephone Encounter (Signed)
Patient sx 7/7. Patient's husband requested to speak to Dr.Dillingham directly and would not disclose further information. Please call to advise/ update [phone]. Thanks.

## 2020-09-23 ENCOUNTER — Encounter (HOSPITAL_BASED_OUTPATIENT_CLINIC_OR_DEPARTMENT_OTHER): Payer: Self-pay | Admitting: Plastic Surgery

## 2020-09-23 LAB — SURGICAL PATHOLOGY

## 2020-09-28 ENCOUNTER — Telehealth: Payer: Self-pay | Admitting: Plastic Surgery

## 2020-09-28 NOTE — Telephone Encounter (Signed)
Patient called and stated there was a cheerio sized hole where one of her incisions are and would like to know next steps. Sx 7/7. Follow up appointment 7/15. Please call to advise [phone]. Thanks.

## 2020-09-28 NOTE — Telephone Encounter (Signed)
Returned patients call. She noticed a tiny hole at the end of the incision left side under the axillary area and is red. The tape from the honeycomb dressing is covering the opening. She denies any fever, chills, nausea, vomiting, nor drainage. Per Catalina [place], leave the honeycomb dressing on until her follow up appointment this Friday. Should the dressing peel down from taking a shower, apply Vaseline and cover with a gauze. Patient understood and agreed with plan.

## 2020-09-30 ENCOUNTER — Encounter: Payer: Self-pay | Admitting: Plastic Surgery

## 2020-09-30 ENCOUNTER — Other Ambulatory Visit: Payer: Self-pay

## 2020-09-30 ENCOUNTER — Ambulatory Visit (INDEPENDENT_AMBULATORY_CARE_PROVIDER_SITE_OTHER): Payer: BC Managed Care – PPO | Admitting: Plastic Surgery

## 2020-09-30 DIAGNOSIS — Z170 Estrogen receptor positive status [ER+]: Secondary | ICD-10-CM

## 2020-09-30 DIAGNOSIS — N6489 Other specified disorders of breast: Secondary | ICD-10-CM

## 2020-09-30 DIAGNOSIS — C50312 Malignant neoplasm of lower-inner quadrant of left female breast: Secondary | ICD-10-CM

## 2020-09-30 NOTE — Progress Notes (Signed)
   Subjective:    Patient ID: Jeanette Macdonald, female    DOB: 11/18/1970, 50 y.o.   MRN: [id number]  The patient is a 50 year old female here for follow-up after undergoing bilateral breast reduction.  Overall she is doing really well.  No sign of hematoma.  No sign of infection.  The swelling and bruising is as expected.  Incisions are healing nicely.  She had a little bit of the rash or skin irritation from the dressing.  I went ahead and removed that.     Review of Systems  Constitutional: Negative.   Eyes: Negative.   Respiratory: Negative.    Cardiovascular: Negative.   Endocrine: Negative.   Genitourinary: Negative.       Objective:   Physical Exam Vitals and nursing note reviewed.  Constitutional:      Appearance: Normal appearance.  Cardiovascular:     Rate and Rhythm: Normal rate.     Pulses: Normal pulses.  Pulmonary:     Effort: Pulmonary effort is normal.  Neurological:     Mental Status: She is alert. Mental status is at baseline.        Assessment & Plan:     ICD-10-CM   1. Malignant neoplasm of lower-inner quadrant of left breast in female, estrogen receptor positive ([place])  C50.312    Z17.0     2. Postoperative breast asymmetry  N64.89       Continue with sports bra and follow-up in 1-2 weeks.

## 2020-10-11 ENCOUNTER — Ambulatory Visit (INDEPENDENT_AMBULATORY_CARE_PROVIDER_SITE_OTHER): Payer: BC Managed Care – PPO | Admitting: Plastic Surgery

## 2020-10-11 ENCOUNTER — Encounter: Payer: Self-pay | Admitting: Plastic Surgery

## 2020-10-11 ENCOUNTER — Other Ambulatory Visit: Payer: Self-pay

## 2020-10-11 DIAGNOSIS — N6489 Other specified disorders of breast: Secondary | ICD-10-CM

## 2020-10-11 NOTE — Progress Notes (Signed)
The patient is a 50 year old female here for follow-up on her bilateral breast reduction.  She had 530 g removed from the right breast and 693 g removed from the left breast.  She said that she was very happy when she got home with the size.  She thinks it is gotten a little bit more swollen over the last week.  She still has bruising and the swelling is apparent.  I think that this will improve and recommend Motrin once or twice a day.  She can go into a regular bra without a wire.  I like to see her back in 1 month.  Pictures were obtained of the patient and placed in the chart with the patient's or guardian's permission.

## 2020-11-08 NOTE — Progress Notes (Signed)
Patient is a 50 year old female with past medical history significant for macromastia s/p bilateral breast reduction performed 09/22/2020.  She was seen most recently here in clinic on 10/11/2020 by Dr. Marla Roe.  She had 530 g removed from right breast and 693 g removed from left breast.  She was happy with her results at that time.  She states that it had gotten a bit more swollen, but it was suspected to be normal postoperative swelling rather than anything requiring intervention.  Recommendations were made for continued NSAIDs.  She was told that she can transition to regular bra.  On my examination, patient is doing well.  She is approximately 7 weeks postop at this time.  She wears underwire bra because she likes the support it provides.  She still has evidence of old bruising over lateral aspect of right breast, but denies any new bruising.  She is that her swelling has improved, although still minimally present.  She is very pleased with the work that has been done, but admits that her breasts are still larger than she would have ideally hoped.  Her physical exam is reassuring.  The Steri-Strips were removed and her incisions have healed well.  There is no evidence of dehiscence.  There are couple of sutures that were visualized, but she preferred to leave them in place, particularly given that they will ultimately dissolve.  There is no redness, irritation, induration, or cellulitic changes.  She is satisfied with the symmetry and shape of the breasts.  Her right areolar complex is mildly darker than the left, but still with good warmth and capillary refill.  No evidence of necrosis.  She also denies any systemic symptoms.  I discussed with patient that she is now safe to resume normal physical activity, as tolerated.  She is pleased that she can return to return to exercise.  Patient to return to clinic in another 4 weeks after which she will only need to see [place] on an as-needed basis.

## 2020-11-11 ENCOUNTER — Ambulatory Visit (INDEPENDENT_AMBULATORY_CARE_PROVIDER_SITE_OTHER): Payer: BC Managed Care – PPO | Admitting: Physician Assistant

## 2020-11-11 ENCOUNTER — Other Ambulatory Visit: Payer: Self-pay

## 2020-11-11 DIAGNOSIS — Z9889 Other specified postprocedural states: Secondary | ICD-10-CM

## 2020-12-30 ENCOUNTER — Other Ambulatory Visit: Payer: Self-pay

## 2020-12-30 ENCOUNTER — Ambulatory Visit (INDEPENDENT_AMBULATORY_CARE_PROVIDER_SITE_OTHER): Payer: BC Managed Care – PPO | Admitting: Physician Assistant

## 2020-12-30 DIAGNOSIS — Z9889 Other specified postprocedural states: Secondary | ICD-10-CM

## 2020-12-30 NOTE — Progress Notes (Signed)
Patient is a 50 year old female with PMH of breast cancer and macromastia s/p bilateral oncoplastic breast reduction performed 09/22/2020 by Dr. Marla Roe who presents to clinic for follow-up.  Patient was last seen on 10/11/2020.  At that time, she felt as though she was a bit more swollen, but is overall pleased with her outcome.  Pictures were placed in chart.  On today's exam, patient reports that she is still satisfied with her breast.  She says that they are larger than she had anticipated.  She states that she can go back she would emphasize that she did not need to spare her nipples and that she simply wanted her breasts to be smaller than the current D (or larger) breast that she has now.  However, she wanted to emphasize that she would still highly recommend this practice and surgeon to her friends and family.  She is pleased with the symmetry and contour.  She simply wishes that they were smaller and states that she might be back in 5 years.  She is otherwise cleared from our service and no specific follow-up required.  She denies any redness, swelling, wounds, new or worsening pain, recent fevers or chills, or any other concerning symptoms.   Physical exam is entirely reassuring.  Patient can safely resume normal activity given that she is now 3 months postop.  Picture(s) obtained of the patient and placed in the chart were with the patient's or guardian's permission.

## 2021-01-24 ENCOUNTER — Telehealth: Payer: Self-pay | Admitting: *Deleted

## 2021-01-24 NOTE — Telephone Encounter (Signed)
Received Immunization information via of fax from [place] on (01/23/21).    Immunization information sent to be scanned into the patient's chart.//AB/CMA
